# Patient Record
Sex: Female | Born: 1950
Health system: Southern US, Community
[De-identification: ages and names within clinical notes are randomized; demographics above are authoritative.]

## PROBLEM LIST (undated history)

## (undated) DIAGNOSIS — E119 Type 2 diabetes mellitus without complications: Secondary | ICD-10-CM

## (undated) DIAGNOSIS — Z86010 Personal history of colonic polyps: Secondary | ICD-10-CM

## (undated) DIAGNOSIS — Z800 Family history of malignant neoplasm of digestive organs: Secondary | ICD-10-CM

## (undated) DIAGNOSIS — J45909 Unspecified asthma, uncomplicated: Secondary | ICD-10-CM

## (undated) DIAGNOSIS — Z808 Family history of malignant neoplasm of other organs or systems: Secondary | ICD-10-CM

## (undated) HISTORY — DX: Type 2 diabetes mellitus without complications: E11.9

## (undated) HISTORY — PX: ABDOMINAL HYSTERECTOMY: SUR658

## (undated) HISTORY — DX: Family history of malignant neoplasm of digestive organs: Z80.0

## (undated) HISTORY — DX: Unspecified asthma, uncomplicated: J45.909

## (undated) HISTORY — DX: Family history of malignant neoplasm of other organs or systems: Z80.8

## (undated) HISTORY — PX: TUBAL LIGATION: SHX77

## (undated) HISTORY — PX: COLONOSCOPY: SHX174

## (undated) HISTORY — PX: POLYPECTOMY: SHX149

## (undated) HISTORY — DX: Personal history of colonic polyps: Z86.010

---

## 1999-01-25 ENCOUNTER — Other Ambulatory Visit: Admission: RE | Admit: 1999-01-25 | Discharge: 1999-01-25 | Payer: Self-pay | Admitting: Obstetrics and Gynecology

## 2001-10-01 ENCOUNTER — Other Ambulatory Visit: Admission: RE | Admit: 2001-10-01 | Discharge: 2001-10-01 | Payer: Self-pay | Admitting: Obstetrics and Gynecology

## 2001-10-01 ENCOUNTER — Encounter: Payer: Self-pay | Admitting: Obstetrics and Gynecology

## 2001-10-01 ENCOUNTER — Encounter: Admission: RE | Admit: 2001-10-01 | Discharge: 2001-10-01 | Payer: Self-pay | Admitting: Obstetrics and Gynecology

## 2001-12-12 ENCOUNTER — Ambulatory Visit (HOSPITAL_COMMUNITY): Admission: RE | Admit: 2001-12-12 | Discharge: 2001-12-12 | Payer: Self-pay | Admitting: Gastroenterology

## 2003-12-04 ENCOUNTER — Ambulatory Visit (HOSPITAL_BASED_OUTPATIENT_CLINIC_OR_DEPARTMENT_OTHER): Admission: RE | Admit: 2003-12-04 | Discharge: 2003-12-04 | Payer: Self-pay | Admitting: Orthopedic Surgery

## 2004-05-12 ENCOUNTER — Encounter (HOSPITAL_COMMUNITY): Admission: RE | Admit: 2004-05-12 | Discharge: 2004-06-11 | Payer: Self-pay | Admitting: Orthopaedic Surgery

## 2004-05-12 ENCOUNTER — Ambulatory Visit (HOSPITAL_COMMUNITY): Admission: RE | Admit: 2004-05-12 | Discharge: 2004-05-12 | Payer: Self-pay | Admitting: Orthopaedic Surgery

## 2011-03-10 ENCOUNTER — Other Ambulatory Visit: Payer: Self-pay | Admitting: Obstetrics & Gynecology

## 2011-03-10 DIAGNOSIS — Z1231 Encounter for screening mammogram for malignant neoplasm of breast: Secondary | ICD-10-CM

## 2011-03-13 ENCOUNTER — Other Ambulatory Visit: Payer: Self-pay | Admitting: Obstetrics & Gynecology

## 2011-03-13 DIAGNOSIS — Z780 Asymptomatic menopausal state: Secondary | ICD-10-CM

## 2011-03-14 ENCOUNTER — Ambulatory Visit
Admission: RE | Admit: 2011-03-14 | Discharge: 2011-03-14 | Disposition: A | Discharge status: Home / Self Care | Payer: 59 | Source: Ambulatory Visit | Attending: Obstetrics & Gynecology | Admitting: Obstetrics & Gynecology

## 2011-03-14 ENCOUNTER — Ambulatory Visit: Payer: Self-pay

## 2011-03-14 DIAGNOSIS — Z1231 Encounter for screening mammogram for malignant neoplasm of breast: Secondary | ICD-10-CM

## 2011-12-26 ENCOUNTER — Other Ambulatory Visit: Payer: Self-pay | Admitting: Gastroenterology

## 2012-09-30 ENCOUNTER — Ambulatory Visit (HOSPITAL_COMMUNITY)
Admission: RE | Admit: 2012-09-30 | Discharge: 2012-09-30 | Disposition: A | Discharge status: Home / Self Care | Payer: BC Managed Care – PPO | Source: Ambulatory Visit | Attending: Physician Assistant | Admitting: Physician Assistant

## 2012-09-30 ENCOUNTER — Other Ambulatory Visit (HOSPITAL_COMMUNITY): Payer: Self-pay | Admitting: Physician Assistant

## 2012-09-30 DIAGNOSIS — R059 Cough, unspecified: Secondary | ICD-10-CM | POA: Insufficient documentation

## 2012-09-30 DIAGNOSIS — R05 Cough: Secondary | ICD-10-CM | POA: Insufficient documentation

## 2012-09-30 DIAGNOSIS — J209 Acute bronchitis, unspecified: Secondary | ICD-10-CM

## 2012-09-30 DIAGNOSIS — R0602 Shortness of breath: Secondary | ICD-10-CM | POA: Insufficient documentation

## 2012-09-30 DIAGNOSIS — R062 Wheezing: Secondary | ICD-10-CM | POA: Insufficient documentation

## 2014-12-04 ENCOUNTER — Other Ambulatory Visit: Payer: Self-pay

## 2014-12-04 DIAGNOSIS — Z1231 Encounter for screening mammogram for malignant neoplasm of breast: Secondary | ICD-10-CM

## 2014-12-15 ENCOUNTER — Ambulatory Visit
Admission: RE | Admit: 2014-12-15 | Discharge: 2014-12-15 | Disposition: A | Discharge status: Home / Self Care | Payer: Medicare Other | Source: Ambulatory Visit

## 2014-12-15 ENCOUNTER — Encounter (INDEPENDENT_AMBULATORY_CARE_PROVIDER_SITE_OTHER): Payer: Self-pay

## 2014-12-15 DIAGNOSIS — Z1231 Encounter for screening mammogram for malignant neoplasm of breast: Secondary | ICD-10-CM

## 2015-03-02 ENCOUNTER — Other Ambulatory Visit (HOSPITAL_COMMUNITY): Payer: Self-pay | Admitting: Physician Assistant

## 2015-03-02 DIAGNOSIS — Z79899 Other long term (current) drug therapy: Secondary | ICD-10-CM

## 2015-03-02 DIAGNOSIS — M8580 Other specified disorders of bone density and structure, unspecified site: Secondary | ICD-10-CM

## 2015-03-08 ENCOUNTER — Ambulatory Visit (HOSPITAL_COMMUNITY)
Admission: RE | Admit: 2015-03-08 | Discharge: 2015-03-08 | Disposition: A | Discharge status: Home / Self Care | Payer: Medicare HMO | Source: Ambulatory Visit | Attending: Physician Assistant | Admitting: Physician Assistant

## 2015-03-08 DIAGNOSIS — Z79899 Other long term (current) drug therapy: Secondary | ICD-10-CM

## 2015-03-08 DIAGNOSIS — R29890 Loss of height: Secondary | ICD-10-CM | POA: Insufficient documentation

## 2015-03-08 DIAGNOSIS — M8580 Other specified disorders of bone density and structure, unspecified site: Secondary | ICD-10-CM | POA: Diagnosis not present

## 2015-03-08 DIAGNOSIS — Z780 Asymptomatic menopausal state: Secondary | ICD-10-CM | POA: Insufficient documentation

## 2016-02-16 ENCOUNTER — Ambulatory Visit (INDEPENDENT_AMBULATORY_CARE_PROVIDER_SITE_OTHER): Payer: Medicare HMO

## 2016-02-16 ENCOUNTER — Ambulatory Visit (INDEPENDENT_AMBULATORY_CARE_PROVIDER_SITE_OTHER): Payer: Medicare HMO | Admitting: Podiatry

## 2016-02-16 VITALS — BP 141/71 | HR 77 | Resp 16

## 2016-02-16 DIAGNOSIS — M79671 Pain in right foot: Secondary | ICD-10-CM

## 2016-02-16 DIAGNOSIS — M7731 Calcaneal spur, right foot: Secondary | ICD-10-CM | POA: Diagnosis not present

## 2016-02-16 DIAGNOSIS — M722 Plantar fascial fibromatosis: Secondary | ICD-10-CM

## 2016-02-16 NOTE — Patient Instructions (Signed)

## 2016-02-16 NOTE — Progress Notes (Signed)
   Subjective:    Patient ID: Katie Lucero, female    DOB: Oct 03, 1951, 65 y.o.   MRN: [id number]  HPI    Review of Systems  All other systems reviewed and are negative.      Objective:   Physical Exam        Assessment & Plan:

## 2016-02-17 NOTE — Progress Notes (Signed)
Subjective:     Patient ID: Katie Lucero, female   DOB: 1951-04-27, 65 y.o.   MRN: [id number]  HPI patient presents with a two-year history of extreme plantar fasciitis and the right heel. States that she's at the point where she cannot be active or do any type of walking and she has gained weight due to the discomfort. Patient states that she has tried injections tried inserts tried reduced activity and shoe gear modifications   Review of Systems  All other systems reviewed and are negative.      Objective:   Physical Exam  Constitutional: She is oriented to person, place, and time.  Cardiovascular: Intact distal pulses.   Musculoskeletal: Normal range of motion.  Neurological: She is oriented to person, place, and time.  Skin: Skin is warm.  Nursing note and vitals reviewed.  neurovascular status found to be intact muscle strength was adequate range of motion within normal limits with patient found to have exquisite discomfort plantar aspect right heel at the insertional point of the tendon into the calcaneus with inflammation and fluid around the medial band. Patient's found to have good digital perfusion is well oriented 3 with mild equinus condition noted and has mild depression of the arch noted     Assessment:     Acute plantar fasciitis right with inflammation and fluid of the medial and central band    Plan:   H&P and x-rays reviewed with patient. We discussed different treatment options available for this particular condition and due to the fact she's had previous injection she's had different conservative treatments and it's been present for 2 years I discussed surgery for shockwave therapy. I have opted for shockwave therapy which I think we'll be in her best interest and I explained is no guarantee that this will get it better and it is possible that ultimately we will need surgical intervention in this case. And at this time was given all instructions on shockwave and I  did dispense air fracture walker with all instructions on usage and patient will be seen back for shockwave therapy in the next week  X-ray report indicates large spur formation plantar heel with no indications of stress fracture arthritis

## 2016-02-21 ENCOUNTER — Other Ambulatory Visit: Payer: Medicare HMO

## 2016-02-23 ENCOUNTER — Ambulatory Visit (INDEPENDENT_AMBULATORY_CARE_PROVIDER_SITE_OTHER): Payer: Medicare HMO

## 2016-02-23 DIAGNOSIS — M7731 Calcaneal spur, right foot: Secondary | ICD-10-CM

## 2016-02-23 DIAGNOSIS — M722 Plantar fascial fibromatosis: Secondary | ICD-10-CM

## 2016-02-23 NOTE — Progress Notes (Signed)
   Subjective:    Patient ID: Katie Lucero, female    DOB: 01/23/1951, 65 y.o.   MRN: [id number]  HPI Pt presents with continuing pain in her right heel, she is here today for shockwave therapy   Review of Systems    All other systems negative Objective:   Physical Exam  Pain upon palpation of the right heel mid to medial heel pain present with tightness and slight swelling of the plantar fascia.     Assessment & Plan:  Shockwave therapy delivered to mid/medial heel band, right foot. Therapy reached levels of 3.2 at 17hz  for 3000 pulses, with therapeutic massage given  before shockwave session. Advised against usage of anti-inflammatories, advised to use supportive shoes. She will follow up in 1 week for 2nd treatment, sooner if problems arise

## 2016-02-28 ENCOUNTER — Other Ambulatory Visit: Payer: Medicare HMO

## 2016-03-01 ENCOUNTER — Ambulatory Visit (INDEPENDENT_AMBULATORY_CARE_PROVIDER_SITE_OTHER): Payer: Medicare HMO

## 2016-03-01 DIAGNOSIS — M722 Plantar fascial fibromatosis: Secondary | ICD-10-CM

## 2016-03-01 NOTE — Progress Notes (Signed)
   Subjective:    Patient ID: Katie Lucero, female    DOB: 1951-06-20, 65 y.o.   MRN: [id number]  HPI Pt presents with continuing pain in her right heel, she is here today for shockwave therapy. She states that her pain has improved from 9 out of 10 at last visit to 3 out of 10 at this visit.   Review of Systems    All other systems negative Objective:   Physical Exam   Mild pain upon palpation of the right heel mid to medial heel pain present with tightness,  Swelling has improved.      Assessment & Plan:  Shockwave therapy delivered to mid/medial heel band, right foot. Therapy reached levels of 4.4 at 17hz  for 3000 pulses, with therapeutic massage given after shockwave session. Advised against usage of anti-inflammatories, advised to use supportive shoes. She will follow up in 1 week for 3rd treatment, sooner if problems arise

## 2016-03-06 ENCOUNTER — Other Ambulatory Visit: Payer: Medicare HMO

## 2016-03-08 ENCOUNTER — Ambulatory Visit: Payer: Medicare HMO

## 2016-03-08 DIAGNOSIS — M722 Plantar fascial fibromatosis: Secondary | ICD-10-CM

## 2016-03-08 NOTE — Progress Notes (Signed)
   Subjective:    Patient ID: Katie Lucero, female    DOB: [birth year], 65 y.o.   MRN: [id number]  HPI Pt presents with continuing pain in her right heel, she is here today for shockwave therapy. She states that her pain has improved from 9 out of 10 at last visit to 3 out of 10 at this visit.   Review of Systems    All other systems negative Objective:   Physical Exam   Mild pain upon palpation of the right heel mid to medial heel pain present with tightness,  Swelling has improved.      Assessment & Plan:  Shockwave therapy delivered to mid/medial heel band, right foot. Therapy reached levels of 4.7 at 17hz  for 3000 pulses, with therapeutic massage given after shockwave session. Advised against usage of anti-inflammatories, advised to use supportive shoes. She will follow up in 4-6 weeks for 4th treatment, sooner if problems arise

## 2016-03-13 ENCOUNTER — Other Ambulatory Visit: Payer: Medicare HMO

## 2016-11-28 DIAGNOSIS — R1032 Left lower quadrant pain: Secondary | ICD-10-CM | POA: Diagnosis not present

## 2016-11-28 DIAGNOSIS — Z124 Encounter for screening for malignant neoplasm of cervix: Secondary | ICD-10-CM | POA: Diagnosis not present

## 2016-11-28 DIAGNOSIS — Z1231 Encounter for screening mammogram for malignant neoplasm of breast: Secondary | ICD-10-CM | POA: Diagnosis not present

## 2016-11-28 DIAGNOSIS — R102 Pelvic and perineal pain: Secondary | ICD-10-CM | POA: Diagnosis not present

## 2016-12-10 DIAGNOSIS — R05 Cough: Secondary | ICD-10-CM | POA: Diagnosis not present

## 2016-12-10 DIAGNOSIS — J111 Influenza due to unidentified influenza virus with other respiratory manifestations: Secondary | ICD-10-CM | POA: Diagnosis not present

## 2016-12-12 DIAGNOSIS — J069 Acute upper respiratory infection, unspecified: Secondary | ICD-10-CM | POA: Diagnosis not present

## 2016-12-12 DIAGNOSIS — Z6838 Body mass index (BMI) 38.0-38.9, adult: Secondary | ICD-10-CM | POA: Diagnosis not present

## 2016-12-12 DIAGNOSIS — Z1389 Encounter for screening for other disorder: Secondary | ICD-10-CM | POA: Diagnosis not present

## 2016-12-13 DIAGNOSIS — R1032 Left lower quadrant pain: Secondary | ICD-10-CM | POA: Diagnosis not present

## 2016-12-21 DIAGNOSIS — H5200 Hypermetropia, unspecified eye: Secondary | ICD-10-CM | POA: Diagnosis not present

## 2016-12-21 DIAGNOSIS — E119 Type 2 diabetes mellitus without complications: Secondary | ICD-10-CM | POA: Diagnosis not present

## 2016-12-21 DIAGNOSIS — Z0100 Encounter for examination of eyes and vision without abnormal findings: Secondary | ICD-10-CM | POA: Diagnosis not present

## 2017-04-04 DIAGNOSIS — Z0000 Encounter for general adult medical examination without abnormal findings: Secondary | ICD-10-CM | POA: Diagnosis not present

## 2017-04-04 DIAGNOSIS — Z9181 History of falling: Secondary | ICD-10-CM | POA: Diagnosis not present

## 2017-04-04 DIAGNOSIS — Z79899 Other long term (current) drug therapy: Secondary | ICD-10-CM | POA: Diagnosis not present

## 2017-04-04 DIAGNOSIS — E119 Type 2 diabetes mellitus without complications: Secondary | ICD-10-CM | POA: Diagnosis not present

## 2017-04-04 DIAGNOSIS — Z7984 Long term (current) use of oral hypoglycemic drugs: Secondary | ICD-10-CM | POA: Diagnosis not present

## 2017-05-16 DIAGNOSIS — Z6838 Body mass index (BMI) 38.0-38.9, adult: Secondary | ICD-10-CM | POA: Diagnosis not present

## 2017-05-16 DIAGNOSIS — Z1389 Encounter for screening for other disorder: Secondary | ICD-10-CM | POA: Diagnosis not present

## 2017-05-16 DIAGNOSIS — E669 Obesity, unspecified: Secondary | ICD-10-CM | POA: Diagnosis not present

## 2017-05-16 DIAGNOSIS — E119 Type 2 diabetes mellitus without complications: Secondary | ICD-10-CM | POA: Diagnosis not present

## 2017-07-23 DIAGNOSIS — L718 Other rosacea: Secondary | ICD-10-CM | POA: Diagnosis not present

## 2017-07-23 DIAGNOSIS — D485 Neoplasm of uncertain behavior of skin: Secondary | ICD-10-CM | POA: Diagnosis not present

## 2017-07-23 DIAGNOSIS — B078 Other viral warts: Secondary | ICD-10-CM | POA: Diagnosis not present

## 2017-07-23 DIAGNOSIS — L821 Other seborrheic keratosis: Secondary | ICD-10-CM | POA: Diagnosis not present

## 2017-08-08 DIAGNOSIS — R69 Illness, unspecified: Secondary | ICD-10-CM | POA: Diagnosis not present

## 2017-08-25 DIAGNOSIS — E119 Type 2 diabetes mellitus without complications: Secondary | ICD-10-CM | POA: Diagnosis not present

## 2017-08-25 DIAGNOSIS — J069 Acute upper respiratory infection, unspecified: Secondary | ICD-10-CM | POA: Diagnosis not present

## 2017-08-25 DIAGNOSIS — J029 Acute pharyngitis, unspecified: Secondary | ICD-10-CM | POA: Diagnosis not present

## 2017-08-27 DIAGNOSIS — J029 Acute pharyngitis, unspecified: Secondary | ICD-10-CM | POA: Diagnosis not present

## 2017-08-27 DIAGNOSIS — Z6838 Body mass index (BMI) 38.0-38.9, adult: Secondary | ICD-10-CM | POA: Diagnosis not present

## 2017-08-27 DIAGNOSIS — M436 Torticollis: Secondary | ICD-10-CM | POA: Diagnosis not present

## 2017-08-27 DIAGNOSIS — Z1389 Encounter for screening for other disorder: Secondary | ICD-10-CM | POA: Diagnosis not present

## 2017-09-12 DIAGNOSIS — E119 Type 2 diabetes mellitus without complications: Secondary | ICD-10-CM | POA: Diagnosis not present

## 2017-09-12 DIAGNOSIS — Z6838 Body mass index (BMI) 38.0-38.9, adult: Secondary | ICD-10-CM | POA: Diagnosis not present

## 2017-11-17 DIAGNOSIS — R69 Illness, unspecified: Secondary | ICD-10-CM | POA: Diagnosis not present

## 2018-01-16 DIAGNOSIS — Z0100 Encounter for examination of eyes and vision without abnormal findings: Secondary | ICD-10-CM | POA: Diagnosis not present

## 2018-01-16 DIAGNOSIS — H5200 Hypermetropia, unspecified eye: Secondary | ICD-10-CM | POA: Diagnosis not present

## 2018-01-16 DIAGNOSIS — E109 Type 1 diabetes mellitus without complications: Secondary | ICD-10-CM | POA: Diagnosis not present

## 2018-01-16 DIAGNOSIS — E119 Type 2 diabetes mellitus without complications: Secondary | ICD-10-CM | POA: Diagnosis not present

## 2018-03-18 DIAGNOSIS — Z1389 Encounter for screening for other disorder: Secondary | ICD-10-CM | POA: Diagnosis not present

## 2018-03-18 DIAGNOSIS — E119 Type 2 diabetes mellitus without complications: Secondary | ICD-10-CM | POA: Diagnosis not present

## 2018-03-18 DIAGNOSIS — Z6838 Body mass index (BMI) 38.0-38.9, adult: Secondary | ICD-10-CM | POA: Diagnosis not present

## 2018-03-27 ENCOUNTER — Telehealth: Payer: Self-pay | Admitting: Gastroenterology

## 2018-03-27 NOTE — Telephone Encounter (Signed)
Hi Dr. Silverio Decamp, this patient is looking to transfer her care over to you as one of her friends is a patient of yours. Her last colon was in 2013 and she is over due for a repeat one. We have received her records for your review. Please advise, thank you.

## 2018-03-28 NOTE — Telephone Encounter (Signed)
Ok to schedule if she is due for the procedure, please scan the previous colonoscopy report so I can review it. Thank you

## 2018-03-28 NOTE — Telephone Encounter (Signed)
Left message to call back  

## 2018-04-03 ENCOUNTER — Encounter: Payer: Self-pay | Admitting: Gastroenterology

## 2018-05-07 DIAGNOSIS — R102 Pelvic and perineal pain: Secondary | ICD-10-CM | POA: Diagnosis not present

## 2018-05-07 DIAGNOSIS — R1032 Left lower quadrant pain: Secondary | ICD-10-CM | POA: Diagnosis not present

## 2018-05-07 DIAGNOSIS — R140 Abdominal distension (gaseous): Secondary | ICD-10-CM | POA: Diagnosis not present

## 2018-05-09 ENCOUNTER — Other Ambulatory Visit: Payer: Self-pay | Admitting: Obstetrics & Gynecology

## 2018-05-09 DIAGNOSIS — R1084 Generalized abdominal pain: Secondary | ICD-10-CM

## 2018-05-09 DIAGNOSIS — R509 Fever, unspecified: Secondary | ICD-10-CM

## 2018-05-09 DIAGNOSIS — R1032 Left lower quadrant pain: Secondary | ICD-10-CM

## 2018-05-09 DIAGNOSIS — R140 Abdominal distension (gaseous): Secondary | ICD-10-CM

## 2018-05-15 ENCOUNTER — Encounter: Payer: Self-pay | Admitting: Gastroenterology

## 2018-05-15 ENCOUNTER — Other Ambulatory Visit: Payer: Self-pay

## 2018-05-15 ENCOUNTER — Ambulatory Visit (AMBULATORY_SURGERY_CENTER): Payer: Self-pay

## 2018-05-15 VITALS — Ht 64.5 in | Wt 225.8 lb

## 2018-05-15 DIAGNOSIS — Z86010 Personal history of colonic polyps: Secondary | ICD-10-CM

## 2018-05-15 DIAGNOSIS — Z800 Family history of malignant neoplasm of digestive organs: Secondary | ICD-10-CM

## 2018-05-15 MED ORDER — NA SULFATE-K SULFATE-MG SULF 17.5-3.13-1.6 GM/177ML PO SOLN: 1.0000 | Freq: Once | ORAL | 0 refills | Status: AC

## 2018-05-15 NOTE — Progress Notes (Signed)
No egg or soy allergy known to patient  No issues with past sedation with any surgeries  or procedures, no intubation problems  No diet pills per patient No home 02 use per patient  No blood thinners per patient  Pt denies issues with constipation  No A fib or A flutter  EMMI video sent to pt's e mail , pt declined    

## 2018-05-17 ENCOUNTER — Ambulatory Visit
Admission: RE | Admit: 2018-05-17 | Discharge: 2018-05-17 | Disposition: A | Discharge status: Home / Self Care | Payer: Medicare HMO | Source: Ambulatory Visit | Attending: Obstetrics & Gynecology | Admitting: Obstetrics & Gynecology

## 2018-05-17 DIAGNOSIS — R1032 Left lower quadrant pain: Secondary | ICD-10-CM

## 2018-05-17 DIAGNOSIS — R1084 Generalized abdominal pain: Secondary | ICD-10-CM

## 2018-05-17 DIAGNOSIS — K760 Fatty (change of) liver, not elsewhere classified: Secondary | ICD-10-CM | POA: Diagnosis not present

## 2018-05-17 DIAGNOSIS — R140 Abdominal distension (gaseous): Secondary | ICD-10-CM

## 2018-05-17 DIAGNOSIS — R509 Fever, unspecified: Secondary | ICD-10-CM

## 2018-05-29 ENCOUNTER — Ambulatory Visit (AMBULATORY_SURGERY_CENTER): Payer: Medicare HMO | Admitting: Gastroenterology

## 2018-05-29 ENCOUNTER — Encounter: Payer: Self-pay | Admitting: Gastroenterology

## 2018-05-29 VITALS — BP 147/70 | HR 61 | Temp 97.7°F | Resp 10 | Ht 64.0 in | Wt 225.0 lb

## 2018-05-29 DIAGNOSIS — Z800 Family history of malignant neoplasm of digestive organs: Secondary | ICD-10-CM

## 2018-05-29 DIAGNOSIS — D120 Benign neoplasm of cecum: Secondary | ICD-10-CM

## 2018-05-29 DIAGNOSIS — D122 Benign neoplasm of ascending colon: Secondary | ICD-10-CM

## 2018-05-29 DIAGNOSIS — Z86010 Personal history of colonic polyps: Secondary | ICD-10-CM | POA: Diagnosis not present

## 2018-05-29 DIAGNOSIS — Z1211 Encounter for screening for malignant neoplasm of colon: Secondary | ICD-10-CM | POA: Diagnosis not present

## 2018-05-29 DIAGNOSIS — D124 Benign neoplasm of descending colon: Secondary | ICD-10-CM | POA: Diagnosis not present

## 2018-05-29 DIAGNOSIS — J45909 Unspecified asthma, uncomplicated: Secondary | ICD-10-CM | POA: Diagnosis not present

## 2018-05-29 DIAGNOSIS — D123 Benign neoplasm of transverse colon: Secondary | ICD-10-CM

## 2018-05-29 DIAGNOSIS — E119 Type 2 diabetes mellitus without complications: Secondary | ICD-10-CM | POA: Diagnosis not present

## 2018-05-29 MED ORDER — SODIUM CHLORIDE 0.9 % IV SOLN
500.0000 mL | Freq: Once | INTRAVENOUS | Status: DC
Start: 2018-05-29 — End: 2018-05-29

## 2018-05-29 NOTE — Progress Notes (Signed)
Pt's states no medical or surgical changes since previsit or office visit. 

## 2018-05-29 NOTE — Progress Notes (Signed)
Called to room to assist during endoscopic procedure.  Patient ID and intended procedure confirmed with present staff. Received instructions for my participation in the procedure from the performing physician.  

## 2018-05-29 NOTE — Op Note (Signed)
[facility] Patient Name: Katie Lucero Procedure Date: 05/29/2018 12:03 PM MRN: [id number] Endoscopist: Mauri Pole , MD Age: 67 Referring MD:  Date of Birth: 1951/05/11 Gender: Female Account #: [id number] Procedure:                Colonoscopy Indications:              Screening in patient at increased risk: Family                            history of 1st-degree relative with colorectal                            cancer before age 28 years, Colon cancer screening                            in patient at increased risk: Family history of                            colorectal cancer in multiple 2nd degree relatives,                            High risk colon cancer surveillance: Personal                            history of colonic polyps Medicines:                Monitored Anesthesia Care Procedure:                Pre-Anesthesia Assessment:                           - Prior to the procedure, a History and Physical                            was performed, and patient medications and                            allergies were reviewed. The patient's tolerance of                            previous anesthesia was also reviewed. The risks                            and benefits of the procedure and the sedation                            options and risks were discussed with the patient.                            All questions were answered, and informed consent                            was obtained. Prior Anticoagulants: The patient has  taken no previous anticoagulant or antiplatelet                            agents. ASA Grade Assessment: II - A patient with                            mild systemic disease. After reviewing the risks                            and benefits, the patient was deemed in                            satisfactory condition to undergo the procedure.                           After obtaining informed consent, the  colonoscope                            was passed under direct vision. Throughout the                            procedure, the patient's blood pressure, pulse, and                            oxygen saturations were monitored continuously. The                            Colonoscope was introduced through the anus and                            advanced to the the cecum, identified by                            appendiceal orifice and ileocecal valve. The                            colonoscopy was performed without difficulty. The                            patient tolerated the procedure well. The quality                            of the bowel preparation was good. The ileocecal                            valve, appendiceal orifice, and rectum were                            photographed. Scope In: 12:12:30 PM Scope Out: 12:45:25 PM Scope Withdrawal Time: 0 hours 22 minutes 5 seconds  Total Procedure Duration: 0 hours 32 minutes 55 seconds  Findings:                 The perianal and digital rectal examinations were  normal.                           Three sessile polyps were found in the transverse                            colon, ascending colon and cecum. The polyps were 1                            to 3 mm in size. These polyps were removed with a                            cold biopsy forceps. Resection and retrieval were                            complete.                           The ileocecal valve was moderately lipomatous.                            Biopsies were taken with a cold forceps for                            histology.                           Five sessile polyps were found in the descending                            colon. The polyps were 4 to 8 mm in size. These                            polyps were removed with a cold snare. Resection                            and retrieval were complete.                           Non-bleeding  internal hemorrhoids were found during                            retroflexion. The hemorrhoids were medium-sized. Complications:            No immediate complications. Estimated Blood Loss:     Estimated blood loss was minimal. Impression:               - Three 1 to 3 mm polyps in the transverse colon,                            in the ascending colon and in the cecum, removed                            with a cold biopsy forceps. Resected and retrieved.                           -  Lipomatous ileocecal valve. Biopsied.                           - Five 4 to 8 mm polyps in the descending colon,                            removed with a cold snare. Resected and retrieved.                           - Non-bleeding internal hemorrhoids. Recommendation:           - Patient has a contact number available for                            emergencies. The signs and symptoms of potential                            delayed complications were discussed with the                            patient. Return to normal activities tomorrow.                            Written discharge instructions were provided to the                            patient.                           - Resume previous diet.                           - Continue present medications.                           - Await pathology results.                           - Repeat colonoscopy date to be determined after                            pending pathology results are reviewed for                            surveillance based on pathology results.                           - Refer to a genetics counselor at appointment to                            be scheduled. Mauri Pole, MD 05/29/2018 12:50:51 PM This report has been signed electronically.

## 2018-05-29 NOTE — Patient Instructions (Signed)
HANDOUTS GIVEN FOR POLYPS AND HEMORRHOIDS  YOU HAD AN ENDOSCOPIC PROCEDURE TODAY AT THE [facility]:   Refer to the procedure report that was given to you for any specific questions about what was found during the examination.  If the procedure report does not answer your questions, please call your gastroenterologist to clarify.  If you requested that your care partner not be given the details of your procedure findings, then the procedure report has been included in a sealed envelope for you to review at your convenience later.  YOU SHOULD EXPECT: Some feelings of bloating in the abdomen. Passage of more gas than usual.  Walking can help get rid of the air that was put into your GI tract during the procedure and reduce the bloating. If you had a lower endoscopy (such as a colonoscopy or flexible sigmoidoscopy) you may notice spotting of blood in your stool or on the toilet paper. If you underwent a bowel prep for your procedure, you may not have a normal bowel movement for a few days.  Please Note:  You might notice some irritation and congestion in your nose or some drainage.  This is from the oxygen used during your procedure.  There is no need for concern and it should clear up in a day or so.  SYMPTOMS TO REPORT IMMEDIATELY:   Following lower endoscopy (colonoscopy or flexible sigmoidoscopy):  Excessive amounts of blood in the stool  Significant tenderness or worsening of abdominal pains  Swelling of the abdomen that is new, acute  Fever of 100F or higher   For urgent or emergent issues, a gastroenterologist can be reached at any hour by calling [phone].   DIET:  We do recommend a small meal at first, but then you may proceed to your regular diet.  Drink plenty of fluids but you should avoid alcoholic beverages for 24 hours.  ACTIVITY:  You should plan to take it easy for the rest of today and you should NOT DRIVE or use heavy machinery until tomorrow (because of the  sedation medicines used during the test).    FOLLOW UP: Our staff will call the number listed on your records the next business day following your procedure to check on you and address any questions or concerns that you may have regarding the information given to you following your procedure. If we do not reach you, we will leave a message.  However, if you are feeling well and you are not experiencing any problems, there is no need to return our call.  We will assume that you have returned to your regular daily activities without incident.  If any biopsies were taken you will be contacted by phone or by letter within the next 1-3 weeks.  Please call us at [phone] if you have not heard about the biopsies in 3 weeks.    SIGNATURES/CONFIDENTIALITY: You and/or your care partner have signed paperwork which will be entered into your electronic medical record.  These signatures attest to the fact that that the information above on your After Visit Summary has been reviewed and is understood.  Full responsibility of the confidentiality of this discharge information lies with you and/or your care-partner. 

## 2018-05-29 NOTE — Progress Notes (Signed)
Report given to PACU, vss 

## 2018-05-30 ENCOUNTER — Other Ambulatory Visit: Payer: Self-pay

## 2018-05-30 ENCOUNTER — Telehealth: Payer: Self-pay

## 2018-05-30 DIAGNOSIS — Z86010 Personal history of colonic polyps: Secondary | ICD-10-CM

## 2018-05-30 DIAGNOSIS — Z800 Family history of malignant neoplasm of digestive organs: Secondary | ICD-10-CM

## 2018-05-30 NOTE — Telephone Encounter (Signed)
  Follow up Call-  Call Manson [facility] number 05/29/2018  Post procedure Call Rasheida Broden phone  # [phone]  Permission to leave phone message Yes  Some recent data might be hidden     Patient questions:  Do you have a fever, pain , or abdominal swelling? No. Pain Score  0 *  Have you tolerated food without any problems? Yes.    Have you been able to return to your normal activities? Yes.    Do you have any questions about your discharge instructions: Diet   No. Medications  No. Follow up visit  No.  Do you have questions or concerns about your Care? No.  Actions: * If pain score is 4 or above: No action needed, pain <4.

## 2018-05-31 ENCOUNTER — Telehealth: Payer: Self-pay | Admitting: Genetics

## 2018-05-31 ENCOUNTER — Encounter: Payer: Self-pay | Admitting: Genetics

## 2018-05-31 NOTE — Telephone Encounter (Signed)
New genetic counseling referral received from Dr. Silverio Decamp for fhx of colon cancer/personal hx of colonic polyps. Pt has been scheduled to see Ferol Luz on 8/27 at 11am. Address verified. Letter mailed.

## 2018-06-05 DIAGNOSIS — J029 Acute pharyngitis, unspecified: Secondary | ICD-10-CM | POA: Diagnosis not present

## 2018-06-05 DIAGNOSIS — B9689 Other specified bacterial agents as the cause of diseases classified elsewhere: Secondary | ICD-10-CM | POA: Diagnosis not present

## 2018-06-05 DIAGNOSIS — E119 Type 2 diabetes mellitus without complications: Secondary | ICD-10-CM | POA: Diagnosis not present

## 2018-06-05 DIAGNOSIS — Z1389 Encounter for screening for other disorder: Secondary | ICD-10-CM | POA: Diagnosis not present

## 2018-06-05 DIAGNOSIS — Z6838 Body mass index (BMI) 38.0-38.9, adult: Secondary | ICD-10-CM | POA: Diagnosis not present

## 2018-06-05 DIAGNOSIS — J019 Acute sinusitis, unspecified: Secondary | ICD-10-CM | POA: Diagnosis not present

## 2018-06-06 ENCOUNTER — Ambulatory Visit (INDEPENDENT_AMBULATORY_CARE_PROVIDER_SITE_OTHER): Payer: Medicare HMO | Admitting: Otolaryngology

## 2018-06-06 DIAGNOSIS — E119 Type 2 diabetes mellitus without complications: Secondary | ICD-10-CM | POA: Diagnosis not present

## 2018-06-13 ENCOUNTER — Encounter: Payer: Self-pay | Admitting: Gastroenterology

## 2018-06-25 ENCOUNTER — Inpatient Hospital Stay: Payer: Medicare HMO

## 2018-06-25 ENCOUNTER — Inpatient Hospital Stay: Payer: Medicare HMO | Attending: Genetic Counselor | Admitting: Genetics

## 2018-06-25 DIAGNOSIS — Z860100 Personal history of colon polyps, unspecified: Secondary | ICD-10-CM

## 2018-06-25 DIAGNOSIS — Z800 Family history of malignant neoplasm of digestive organs: Secondary | ICD-10-CM

## 2018-06-25 DIAGNOSIS — Z808 Family history of malignant neoplasm of other organs or systems: Secondary | ICD-10-CM

## 2018-06-25 DIAGNOSIS — D126 Benign neoplasm of colon, unspecified: Secondary | ICD-10-CM | POA: Diagnosis not present

## 2018-06-26 ENCOUNTER — Encounter: Payer: Self-pay | Admitting: Genetics

## 2018-06-26 DIAGNOSIS — Z808 Family history of malignant neoplasm of other organs or systems: Secondary | ICD-10-CM | POA: Insufficient documentation

## 2018-06-26 DIAGNOSIS — Z860100 Personal history of colon polyps, unspecified: Secondary | ICD-10-CM

## 2018-06-26 DIAGNOSIS — Z800 Family history of malignant neoplasm of digestive organs: Secondary | ICD-10-CM | POA: Insufficient documentation

## 2018-06-26 HISTORY — DX: Personal history of colonic polyps: Z86.010

## 2018-06-26 HISTORY — DX: Personal history of colon polyps, unspecified: Z86.0100

## 2018-06-26 NOTE — Progress Notes (Signed)
REFERRING PROVIDER: Mauri Pole, MD [place], [place] [id number]  PRIMARY PROVIDER:  Sharilyn Sites, MD  PRIMARY REASON FOR VISIT:  1. Family history of colon cancer   2. Family history of stomach cancer   3. Family history of skin cancer   4. Family history of esophageal cancer   5. Personal history of colonic polyps     HISTORY OF PRESENT ILLNESS:   Ms. Besecker, a 67 y.o. female, was seen for a [facility] cancer genetics consultation at the request of Dr. Silverio Decamp due to a personal history of colon polyps and significant family history of cancer.  Ms. Nest presents to clinic today to discuss the possibility of a hereditary predisposition to cancer, genetic testing, and to further clarify her future cancer risks, as well as potential cancer risks for family members.   In 2003, Ms. Stoffer had a tubulovillous adenoma removed from her colon, in 2013, a tubular adenoma was removed.  In July 2019, Ms. Balch had 8 polyps removed which were almost all tubular adenoma pathology.  Ms. Bocchino reports to [place] she has had other colonoscopies in addition to 2003, 2013, and 2019 and she has always had at least a few colon polyps.  We estimate 10+ colon adenomas have been identified.       HORMONAL RISK FACTORS:  Menarche was at age 52.  First live birth at age 20.  Ovaries intact: yes.  Hysterectomy: yes.  Menopausal status: postmenopausal.  HRT use: 0 years. Colonoscopy: yes; see comments above regarding polyps. Repots having breast calcifications for 20 years.    Past Medical History:  Diagnosis Date  . Asthma   . Diabetes mellitus without complication ([place])   . Family history of colon cancer   . Family history of esophageal cancer   . Family history of skin cancer   . Family history of stomach cancer   . Personal history of colonic polyps 06/26/2018    Past Surgical History:  Procedure Laterality Date  . ABDOMINAL HYSTERECTOMY     1968  . COLONOSCOPY    .  POLYPECTOMY    . TUBAL LIGATION     1981    Social History   Socioeconomic History  . Marital status: Married    Spouse name: Not on file  . Number of children: Not on file  . Years of education: Not on file  . Highest education level: Not on file  Occupational History  . Not on file  Social Needs  . Financial resource strain: Not on file  . Food insecurity:    Worry: Not on file    Inability: Not on file  . Transportation needs:    Medical: Not on file    Non-medical: Not on file  Tobacco Use  . Smoking status: Never Smoker  . Smokeless tobacco: Never Used  Substance and Sexual Activity  . Alcohol use: Yes    Alcohol/week: 0.0 standard drinks    Comment: rarely  . Drug use: Never  . Sexual activity: Not on file  Lifestyle  . Physical activity:    Days per week: Not on file    Minutes per session: Not on file  . Stress: Not on file  Relationships  . Social connections:    Talks on phone: Not on file    Gets together: Not on file    Attends religious service: Not on file    Active member of club or organization: Not on file  Attends meetings of clubs or organizations: Not on file    Relationship status: Not on file  Other Topics Concern  . Not on file  Social History Narrative  . Not on file     FAMILY HISTORY:  We obtained a detailed, 4-generation family history.  Significant diagnoses are listed below: Family History  Problem Relation Age of Onset  . Heart disease Mother   . Skin cancer Father   . Heart disease Father   . Colon cancer Sister 57  . Esophageal cancer Paternal Uncle   . Colon cancer Paternal Grandmother 63  . Colon cancer Cousin        dx >50  . Other Brother        brain tumor  . Cancer Maternal Grandmother 71       type unk  . Stomach cancer Paternal Uncle   . Colon cancer Cousin        dx >50  . Rectal cancer Neg Hx     Ms. Wamser has 2 daughters ages 68 and 8, and a son who is 71 with no history of cancer.  None of her  children have had a colonoscopy she is aware of.  Ms. Templin has 5 grandchildren. Ms. Margretta Sidle has 1 sister and 2 brothers: -1 brother died at birth -19 sister died at 58 due to a stroke.  She had a daughter who is currently 61 and in hospice due to 'bowel cancer'.  -1 sister died of colon cancer at 60. She has a daughter who is 15 with no history of cancer.  -1 brother died in older age due to a brain tumor- cannot remember the name of the tumor or weather it was cancerous or not.   Ms. Dombrosky father: died at 59 due to heart disease.  He had a hx of skin cancer.  Paternal Aunts/Uncles: 10 paternal aunts/uncles- limited information is available about all of these relatives.  However, Ms. Loma is aware of a paternal uncle who had stomach cancer and a paternal uncle who had esophageal cancer.  Paternal cousins: 2 paternal cousins had colon cancer dx >50 Paternal grandfather: died at 52 due to stomach cancer Paternal grandmother:died at 82 due to colon cancer.   Ms. Schnepf mother: died at 103 due to heart disease Maternal Aunts/Uncles: 5 maternal aunts/uncles, some died in htier 15's, but no cancer she is aware of Maternal cousins: no history of cancer Maternal grandfather: died of heart disease Maternal grandmother:died of cancer at 46, type of cancer unk  Ms. Westra is unaware of previous family history of genetic testing for hereditary cancer risks. Patient's maternal ancestors are of English/Irish descent, and paternal ancestors are of English/Nigerian descent. There is no reported Ashkenazi Jewish ancestry. There is no known consanguinity.  GENETIC COUNSELING ASSESSMENT: DUYEN BECKOM is a 67 y.o. female with a personal and family history which is somewhat suggestive of a Hereditary Cancer Predisposition Syndrome. We, therefore, discussed and recommended the following at today's visit.   DISCUSSION: We reviewed the characteristics, features and inheritance patterns of hereditary  cancer syndromes. We also discussed genetic testing, including the appropriate family members to test, the process of testing, insurance coverage and turn-around-time for results. We discussed the implications of a negative, positive and/or variant of uncertain significant result. We recommended Ms. Sharber pursue genetic testing for the Multi-Cancer gene panel.   The Multi-Cancer Panel offered by [facility] includes sequencing and/or deletion duplication testing of the following 84 genes: AIP,ALK, APC, ATM,  AXIN2,BAP1,  BARD1, BLM, BMPR1A, BRCA1, BRCA2, BRIP1, CASR, CDC73, CDH1, CDK4, CDKN1B, CDKN1C, CDKN2A (p14ARF), CDKN2A (p16INK4a), CEBPA, CHEK2, CTNNA1, DICER1, DIS3L2, EGFR (c.2369C>T, p.Thr790Met variant only), EPCAM (Deletion/duplication testing only), FH, FLCN, GATA2, GPC3, GREM1 (Promoter region deletion/duplication testing only), HOXB13 (c.251G>A, p.Gly84Glu), HRAS, KIT, MAX, MEN1, MET, MITF (c.952G>A, p.Glu318Lys variant only), MLH1, MSH2, MSH3, MSH6, MUTYH, NBN, NF1, NF2, NTHL1, PALB2, PDGFRA, PHOX2B, PMS2, POLD1, POLE, POT1, PRKAR1A, PTCH1, PTEN, RAD50, RAD51C, RAD51D, RB1, RECQL4, RET, RUNX1, SDHAF2, SDHA (sequence changes only), SDHB, SDHC, SDHD, SMAD4, SMARCA4, SMARCB1, SMARCE1, STK11, SUFU, TERC, TERT, TMEM127, TP53, TSC1, TSC2, VHL, WRN and WT1.    When an individual develops multiple adenomatous colon polyps, there is concern for a condition called Familial Adenomatous Polyposis (FAP).   In the classic form of FAP, people generally have hundreds to thousands of adenomatous polyps.  A milder version of FAP called Attenuated FAP (AFAP) is characterized by less than 100 adenomatous polyps.   There are two genes that have been associated with FAP/AFAP and each has a different pattern of inheritance.  The first gene is called APC and is inherited in an autosomal dominant fashion.  In this case, having only one alteration (mutation) in one copy of the APC gene causes polyps to develop.  In about 30%  of cases caused by APC, the condition is diagnosed for the first time in a family where both parents do not have the condition.  In other words, there are 30% of people with FAP/AFAP where the mutation occurred in them for the first time.     Colon polyposis can also be caused by mutations in the MUTYH gene, which causes a condition known as MUTYH-associated polyposis.  This is an autosomal recessive genetic condition.  In this case, an individual needs to have inherited a mutation in each copy of the MYH gene, one from each parent, in order to develop polyposis.  Carrying just one mutation of the MYH gene does not typically cause any problems or place an individual at risk for cancer.    Another syndrome we were concerned about in your family is called Lynch Syndrome, also called HNPCC (Hereditary Non-Polyposis Colon Cancer).  This syndrome increases the risk for colon, uterine, ovarian and stomach cancers, brain cancers, as well as others.  Families with Lynch Syndrome tend to have multiple family members with these cancers, typically diagnosed under age 18, and diagnoses in multiple generations. The genes that are known to cause Lynch Syndrome are called MLH1, MSH2, MSH6, PMS2 and EPCAM.     Some families that appear to have any of these conditions will have normal testing of these genes.  In those cases it is possible that the large amount of colon polyps may be causes by a syndrome called Serrated Polyposis Syndrome.  This condition causes an abnormally large amount of serrate polyps to develop and believe to be hereditary.  However, the genetic cause of this syndrome has not yet been identified.     We discussed that if she is found to have a mutation in one of these genes, it may impact future medical management recommendations such as increased cancer screenings and consideration of risk reducing surgeries.  A positive result could also have implications for the patient's family members.   A  Negative result would mean we were unable to identify a hereditary component to her development of adenomatous polyps, but does not rule out the possibility of a hereditary basis for her polyps. There could be mutations  that are undetectable by current technology, or in genes not yet tested or identified to increase cancer risk.     We discussed the potential to find a Variant of Uncertain Significance or VUS.  These are variants that have not yet been identified as pathogenic or benign, and it is unknown if this variant is associated with increased cancer risk or if this is a normal finding.  Most VUS's are reclassified to benign or likely benign.   It should not be used to make medical management decisions. With time, we suspect the lab will determine the significance of any VUS's identified if any.   Based on Ms. Detwiler's personal and family history of cancer, she meets medical criteria for genetic testing. Despite that she meets criteria, she may still have an out of pocket cost. The laboratory can provide her with an estimate of her OOP cost.  hospital was provided the contact information for the laboratory if hospital has further questions.   We discussed that some people do not want to undergo genetic testing due to fear of genetic discrimination.  A federal law called the Genetic Information Non-Discrimination Act (GINA) of 2008 helps protect individuals against genetic discrimination based on their genetic test results.  It impacts both health insurance and employment.  For health insurance, it protects against increased premiums, being kicked off insurance or being forced to take a test in order to be insured.  For employment it protects against hiring, firing and promoting decisions based on genetic test results.  Health status due to a cancer diagnosis is not protected under GINA.  This law does not protect life insurance, disability insurance, or other types of insurance.   PLAN: After  considering the risks, benefits, and limitations, Ms. Steger  provided informed consent to pursue genetic testing and the blood sample was sent to [facility] for analysis of the Multi-Cancer Panel. Results should be available within approximately 2-3 weeks' time, at which point they will be disclosed by telephone to Ms. Sarkis, as will any additional recommendations warranted by these results. Ms. Leitzke will receive a summary of her genetic counseling visit and a copy of her results once available. This information will also be available in Epic. We encouraged Ms. Sarkis to remain in contact with cancer genetics annually so that we can continuously update the family history and inform her of any changes in cancer genetics and testing that may be of benefit for her family. Ms. Merritts questions were answered to her satisfaction today. Our contact information was provided should additional questions or concerns arise.  Based on Ms. Kearse's family history, we recommended her siblings/nieces/nephews, and all paternal relatives also, have genetic counseling and testing. Ms. Tung will let us know if we can be of any assistance in coordinating genetic counseling and/or testing for this family member.   Lastly, we encouraged Ms. Gittens to remain in contact with cancer genetics annually so that we can continuously update the family history and inform her of any changes in cancer genetics and testing that may be of benefit for this family.   Ms.  Graig questions were answered to her satisfaction today. Our contact information was provided should additional questions or concerns arise. Thank you for the referral and allowing [place] to share in the care of your patient.   Tana Felts, Ms, [facility] Certified Genetic Counselor [email or web address] phone: [phone]  The patient was seen for a total of 40 minutes in face-to-face genetic counseling.

## 2018-07-08 ENCOUNTER — Encounter: Payer: Self-pay | Admitting: Genetics

## 2018-07-08 ENCOUNTER — Telehealth: Payer: Self-pay | Admitting: Genetics

## 2018-07-08 ENCOUNTER — Ambulatory Visit: Payer: Self-pay | Admitting: Genetics

## 2018-07-08 DIAGNOSIS — Z808 Family history of malignant neoplasm of other organs or systems: Secondary | ICD-10-CM

## 2018-07-08 DIAGNOSIS — Z1379 Encounter for other screening for genetic and chromosomal anomalies: Secondary | ICD-10-CM | POA: Insufficient documentation

## 2018-07-08 DIAGNOSIS — Z86010 Personal history of colonic polyps: Secondary | ICD-10-CM

## 2018-07-08 DIAGNOSIS — Z800 Family history of malignant neoplasm of digestive organs: Secondary | ICD-10-CM

## 2018-07-08 NOTE — Telephone Encounter (Signed)
Revealed negative genetic testing.    This normal result is reassuring and indicates we did not find a genetic cause for her colon polyps/family history of cancer.  However, genetic testing is not perfect, and cannot definitively rule out a hereditary cause.  It will be important for her to keep in contact with genetics to learn if any additional testing may be needed in the future.     We discussed that she still has a strong family history of cancer and personal history of colon polyps and she may still therefore be at a somewhat higher cancer risk simply due to family history.  She should continue to follow her GI /primary care/ other healthcare providers's recommendations regarding cancer screening.   We also discussed that there could still be Lynch syndrome or another hereditary cancer syndrome causing the cancer in her family that Ms. Peth may not have inherited and therefore we would not have identified in her.  We recommended her nices/nephews/siblings, and all paternal relatives also have genetic testing.   We also recommended her children have colonoscopy starting at 40 every 5 years due to Ms. Revelle's history of an advanced adenoma in 2003.   Per pt request we will mail a copy of her results to her.

## 2018-07-08 NOTE — Progress Notes (Signed)
HPI:  Ms. Landers was previously seen in the [facility] clinic on 06/25/2018 due to a personal history of colon polyps and family history of cancer and concerns regarding a hereditary predisposition to cancer. Please refer to our prior cancer genetics clinic note for more information regarding Ms. Spivack's medical, social and family histories, and our assessment and recommendations, at the time. Ms. Schlotzhauer recent genetic test results were disclosed to her, as well as recommendations warranted by these results. These results and recommendations are discussed in more detail below.  COLON POLYP HISTORY:  In 2003, Ms. Pettis had a tubulovillous adenoma removed from her colon, in 2013, a tubular adenoma was removed.  In July 2019, Ms. Ahola had 8 polyps removed which were almost all tubular adenoma pathology.  Ms. Millon reports to [place] she has had other colonoscopies in addition to 2003, 2013, and 2019 and she has always had at least a few colon polyps.  We estimate 10+ colon adenomas have been identified.      FAMILY HISTORY:  We obtained a detailed, 4-generation family history.  Significant diagnoses are listed below: Family History  Problem Relation Age of Onset  . Heart disease Mother   . Skin cancer Father   . Heart disease Father   . Colon cancer Sister 19  . Esophageal cancer Paternal Uncle   . Colon cancer Paternal Grandmother 63  . Colon cancer Cousin        dx >50  . Other Brother        brain tumor  . Cancer Maternal Grandmother 33       type unk  . Stomach cancer Paternal Uncle   . Colon cancer Cousin        dx >50  . Cancer Other 65       bowel  . Rectal cancer Neg Hx     Ms. Brys has 2 daughters ages 16 and 72, and a son who is 6 with no history of cancer.  None of her children have had a colonoscopy she is aware of.  Ms. Engdahl has 5 grandchildren. Ms. Margretta Sidle has 1 sister and 2 brothers: -1 brother died at birth -86 sister died at 57 due to a  stroke.  She had a daughter who is currently 70 and in hospice due to 'bowel cancer'.  -1 sister died of colon cancer at 62. She has a daughter who is 76 with no history of cancer- she has a daughter (patient's great niece) has recently had some 'cancerous cells in her ovaries'.  -1 brother died in older age due to a brain tumor- cannot remember the name of the tumor or weather it was cancerous or not.   Ms. Soth father: died at 70 due to heart disease.  He had a hx of skin cancer.  Paternal Aunts/Uncles: 10 paternal aunts/uncles- limited information is available about all of these relatives.  However, Ms. Joo is aware of a paternal uncle who had stomach cancer and a paternal uncle who had esophageal cancer.  Paternal cousins: 2 paternal cousins had colon cancer dx >50 Paternal grandfather: died at 8 due to stomach cancer Paternal grandmother:died at 76 due to colon cancer.   Ms. Turko mother: died at 20 due to heart disease Maternal Aunts/Uncles: 5 maternal aunts/uncles, some died in htier 67's, but no cancer she is aware of Maternal cousins: no history of cancer Maternal grandfather: died of heart disease Maternal grandmother:died of cancer at 57, type of cancer unk  Ms. Carriero is unaware of previous family history of genetic testing for hereditary cancer risks. Patient's maternal ancestors are of English/Irish descent, and paternal ancestors are of English/Nigerian descent. There is no reported Ashkenazi Jewish ancestry. There is no known consanguinity.  GENETIC TEST RESULTS: Genetic testing performed through [facility]'s Multi-Cancer Panel reported out on 07/08/2018 showed no pathogenic mutations.   The Multi-Cancer Panel offered by [facility] includes sequencing and/or deletion duplication testing of the following 91 genes: AIP, ALK, APC, ATM, AXIN2, BAP1, BARD1, BLM, BMPR1A, BRCA1, BRCA2, BRIP1, BUB1B, CASR, CDC73, CDH1, CDK4, CDKN1B, CDKN1C, CDKN2A, CEBPA, CEP57, CHEK2, CTNNA1,  DICER1, DIS3L2, EGFR, ENG, EPCAM, FH, FLCN, GALNT12, GATA2, GPC3, GREM1, HOXB13, HRAS, KIT, MAX, MEN1, MET, MITF, MLH1, MLH3, MSH2, MSH3, MSH6, MUTYH, NBN, NF1, NF2, NTHL1, PALB2, PDGFRA, PHOX2B, PMS2, POLD1, POLE, POT1, PRKAR1A, PTCH1, PTEN, RAD50, RAD51C, RAD51D, RB1, RECQL4, RET, RNF43, RPS20, RUNX1, SDHA, SDHAF2, SDHB, SDHC, SDHD, SMAD4, SMARCA4, SMARCB1, SMARCE1, STK11, SUFU, TERC, TERT, TMEM127, TP53, TSC1, TSC2, VHL, WRN, WT1  The test report will be scanned into EPIC and will be located under the Molecular Pathology section of the Results Review tab. A portion of the result report is included below for reference.     We discussed with Ms. Daily that because current genetic testing is not perfect, it is possible there may be a gene mutation in one of these genes that current testing cannot detect, but that chance is small.  We also discussed, that there could be another gene that has not yet been discovered, or that we have not yet tested, that is responsible for the cancer diagnoses in the family. It is also possible there is a hereditary cause for the cancer in the family that Ms. Langner did not inherit and therefore was not identified in her testing.  Therefore, it is important to remain in touch with cancer genetics in the future so that we can continue to offer Ms. Age the most up to date genetic testing.   ADDITIONAL GENETIC TESTING:We discussed with Ms. Mancias that her genetic testing was fairly extensive.  If there are are genes identified to increase cancer risk that can be analyzed in the future, we would be happy to discuss and coordinate this testing at that time.    CANCER SCREENING RECOMMENDATIONS: This negative result means that we were unable to identify a hereditary cause for her personal history of colon polyps and family history of cancer at this time.  This result does not rule out a hereditary cause for her  cancer.  It is still possible that there could be genetic  mutations that are undetectable by current technology, or genetic mutations in genes that have not been tested or identified to increase cancer risk.  Given Ms. Blough's personal and family histories, we must consider these negative results carefully.  Families with features suggestive of hereditary risk for cancer tend to have multiple family members with cancer, diagnoses in multiple generations, and diagnoses before the age of 40. Ms. Roberge family exhibits some of these features. Therefore this negative result may actually be due to the limitations of current technology to detect all mutations within these genes, or there may be a different gene that has not yet been discovered or tested.   Another possibility we discussed is that there is a hereditary cause for the cancer in the family that Ms. Royal did not inherit and therefore was not found in her genetic testing.  Therefore, we strongly recommended her relatives (siblings/nieces/nephews/paternal relatives) also  have genetic testing.  Ms. Tebbetts will let us know if we can be of any assistance in coordinating genetic counseling and/or testing for these family members.  It is recommended Ms. Cuadros continue to follow the cancer management and screening guidelines provided by her oncology and primary healthcare provider. Given her history of polyps, she should receive increased colon screening per GI physician recommendations.  An individual's cancer risk is not determined by genetic test results alone.  Overall cancer risk assessment includes additional factors such as personal medical history, family history, etc.  These should be used to make a personalized plan for cancer prevention and surveillance.    RECOMMENDATIONS FOR FAMILY MEMBERS:  Relatives in this family might be at some increased risk of developing cancer, over the general population risk, simply due to the family history of cancer.  We recommended women in this family have a  yearly mammogram beginning at age 53, or 50 years younger than the earliest onset of cancer, an annual clinical breast exam, and perform monthly breast self-exams. Women in this family should also have a gynecological exam as recommended by their primary provider. All family members should have a colonoscopy by age 37 (or as directed by their doctors).  All family members should inform their physicians about the family history of cancer so their doctors can make the most appropriate screening recommendations for them.   Based on Ms. Leber's personal history of advanced adenoma, we recommended her children have colonoscopies starting at age 58 and repeating every 5-10 years.    FOLLOW-UP: Lastly, we discussed with Ms. Ackroyd that cancer genetics is a rapidly advancing field and it is possible that new genetic tests will be appropriate for her and/or her family members in the future. We encouraged her to remain in contact with cancer genetics on an annual basis so we can update her personal and family histories and let her know of advances in cancer genetics that may benefit this family.   Our contact number was provided. Ms. Tryon questions were answered to her satisfaction, and she knows she is welcome to call us at anytime with additional questions or concerns.   Ferol Luz, MS, [facility] Certified Genetic Counselor [email or web address]

## 2018-07-15 ENCOUNTER — Ambulatory Visit (INDEPENDENT_AMBULATORY_CARE_PROVIDER_SITE_OTHER): Payer: Medicare HMO | Admitting: Otolaryngology

## 2018-07-15 DIAGNOSIS — R42 Dizziness and giddiness: Secondary | ICD-10-CM

## 2018-07-15 DIAGNOSIS — H903 Sensorineural hearing loss, bilateral: Secondary | ICD-10-CM | POA: Diagnosis not present

## 2018-07-15 DIAGNOSIS — H8112 Benign paroxysmal vertigo, left ear: Secondary | ICD-10-CM | POA: Diagnosis not present

## 2018-08-12 ENCOUNTER — Ambulatory Visit (INDEPENDENT_AMBULATORY_CARE_PROVIDER_SITE_OTHER): Payer: Medicare HMO | Admitting: Otolaryngology

## 2018-08-12 DIAGNOSIS — H8112 Benign paroxysmal vertigo, left ear: Secondary | ICD-10-CM

## 2018-08-27 DIAGNOSIS — R69 Illness, unspecified: Secondary | ICD-10-CM | POA: Diagnosis not present

## 2018-09-02 DIAGNOSIS — R69 Illness, unspecified: Secondary | ICD-10-CM | POA: Diagnosis not present

## 2018-09-19 DIAGNOSIS — Z0001 Encounter for general adult medical examination with abnormal findings: Secondary | ICD-10-CM | POA: Diagnosis not present

## 2018-09-19 DIAGNOSIS — Z6838 Body mass index (BMI) 38.0-38.9, adult: Secondary | ICD-10-CM | POA: Diagnosis not present

## 2018-09-19 DIAGNOSIS — E119 Type 2 diabetes mellitus without complications: Secondary | ICD-10-CM | POA: Diagnosis not present

## 2018-09-19 DIAGNOSIS — Z1389 Encounter for screening for other disorder: Secondary | ICD-10-CM | POA: Diagnosis not present

## 2018-10-25 DIAGNOSIS — E119 Type 2 diabetes mellitus without complications: Secondary | ICD-10-CM | POA: Diagnosis not present

## 2018-10-25 DIAGNOSIS — J209 Acute bronchitis, unspecified: Secondary | ICD-10-CM | POA: Diagnosis not present

## 2018-11-12 DIAGNOSIS — M545 Low back pain: Secondary | ICD-10-CM | POA: Diagnosis not present

## 2018-11-12 DIAGNOSIS — M25552 Pain in left hip: Secondary | ICD-10-CM | POA: Diagnosis not present

## 2018-11-12 DIAGNOSIS — M5442 Lumbago with sciatica, left side: Secondary | ICD-10-CM | POA: Diagnosis not present

## 2018-11-18 DIAGNOSIS — M25552 Pain in left hip: Secondary | ICD-10-CM | POA: Diagnosis not present

## 2018-11-18 DIAGNOSIS — M7062 Trochanteric bursitis, left hip: Secondary | ICD-10-CM | POA: Diagnosis not present

## 2018-11-27 DIAGNOSIS — Z6838 Body mass index (BMI) 38.0-38.9, adult: Secondary | ICD-10-CM | POA: Diagnosis not present

## 2018-11-27 DIAGNOSIS — Z0001 Encounter for general adult medical examination with abnormal findings: Secondary | ICD-10-CM | POA: Diagnosis not present

## 2018-11-27 DIAGNOSIS — Z1389 Encounter for screening for other disorder: Secondary | ICD-10-CM | POA: Diagnosis not present

## 2018-12-05 DIAGNOSIS — L718 Other rosacea: Secondary | ICD-10-CM | POA: Diagnosis not present

## 2019-03-17 DIAGNOSIS — Z1389 Encounter for screening for other disorder: Secondary | ICD-10-CM | POA: Diagnosis not present

## 2019-03-17 DIAGNOSIS — E119 Type 2 diabetes mellitus without complications: Secondary | ICD-10-CM | POA: Diagnosis not present

## 2019-03-17 DIAGNOSIS — Z6838 Body mass index (BMI) 38.0-38.9, adult: Secondary | ICD-10-CM | POA: Diagnosis not present

## 2019-04-04 DIAGNOSIS — B078 Other viral warts: Secondary | ICD-10-CM | POA: Diagnosis not present

## 2019-06-30 DIAGNOSIS — J4520 Mild intermittent asthma, uncomplicated: Secondary | ICD-10-CM | POA: Diagnosis not present

## 2019-06-30 DIAGNOSIS — E119 Type 2 diabetes mellitus without complications: Secondary | ICD-10-CM | POA: Diagnosis not present

## 2019-06-30 DIAGNOSIS — E669 Obesity, unspecified: Secondary | ICD-10-CM | POA: Diagnosis not present

## 2019-07-30 DIAGNOSIS — E119 Type 2 diabetes mellitus without complications: Secondary | ICD-10-CM | POA: Diagnosis not present

## 2019-07-30 DIAGNOSIS — J4520 Mild intermittent asthma, uncomplicated: Secondary | ICD-10-CM | POA: Diagnosis not present

## 2019-09-03 DIAGNOSIS — R69 Illness, unspecified: Secondary | ICD-10-CM | POA: Diagnosis not present

## 2019-09-24 DIAGNOSIS — E119 Type 2 diabetes mellitus without complications: Secondary | ICD-10-CM | POA: Diagnosis not present

## 2019-09-24 DIAGNOSIS — Z733 Stress, not elsewhere classified: Secondary | ICD-10-CM | POA: Diagnosis not present

## 2019-09-24 DIAGNOSIS — L259 Unspecified contact dermatitis, unspecified cause: Secondary | ICD-10-CM | POA: Diagnosis not present

## 2019-09-24 DIAGNOSIS — Z6837 Body mass index (BMI) 37.0-37.9, adult: Secondary | ICD-10-CM | POA: Diagnosis not present

## 2019-09-24 DIAGNOSIS — Z1389 Encounter for screening for other disorder: Secondary | ICD-10-CM | POA: Diagnosis not present

## 2019-09-24 DIAGNOSIS — Z0001 Encounter for general adult medical examination with abnormal findings: Secondary | ICD-10-CM | POA: Diagnosis not present

## 2019-10-18 ENCOUNTER — Ambulatory Visit
Admission: EM | Admit: 2019-10-18 | Discharge: 2019-10-18 | Disposition: A | Discharge status: Home / Self Care | Payer: Medicare HMO | Attending: Emergency Medicine | Admitting: Emergency Medicine

## 2019-10-18 DIAGNOSIS — H6121 Impacted cerumen, right ear: Secondary | ICD-10-CM | POA: Diagnosis not present

## 2019-10-18 MED ORDER — CARBAMIDE PEROXIDE 6.5 % OT SOLN: 5.0000 [drp] | Freq: Two times a day (BID) | OTIC | 0 refills | Status: DC

## 2019-10-18 MED ORDER — FLUTICASONE PROPIONATE 50 MCG/ACT NA SUSP: 1.0000 | Freq: Every day | NASAL | 0 refills | Status: DC

## 2019-10-18 NOTE — ED Triage Notes (Signed)
Pt has had increased ear fullness over the past 2 weeks in right ear

## 2019-10-18 NOTE — ED Provider Notes (Signed)
[facility]    CSN: [id number] Arrival date & time: 10/18/19  1049      History   Chief Complaint Chief Complaint  Patient presents with  . Ear Fullness    HPI Katie Lucero is a 68 y.o. female.   Katie Lucero 68 y female presents to the urgent care with right ear fullness and mild pain for the past 2 weeks.  He denies a precipitating event, such as swimming or wearing ear plugs.  He states the pain is constant and achy in character.  He rated pain at 2 on a scale of 1-10.  He has tried OTC Tylenol without relief without relief.  His symptoms are made worse with lying down.  He reports denies similar symptoms in the past that improved with nasal spray.    The history is provided by the patient. A language interpreter was used.  Ear Fullness    Past Medical History:  Diagnosis Date  . Asthma   . Diabetes mellitus without complication ([place])   . Family history of colon cancer   . Family history of esophageal cancer   . Family history of skin cancer   . Family history of stomach cancer   . Personal history of colonic polyps 06/26/2018    Patient Active Problem List   Diagnosis Date Noted  . Genetic testing 07/08/2018  . Personal history of colonic polyps 06/26/2018  . Family history of colon cancer   . Family history of stomach cancer   . Family history of skin cancer   . Family history of esophageal cancer     Past Surgical History:  Procedure Laterality Date  . ABDOMINAL HYSTERECTOMY     1968  . COLONOSCOPY    . POLYPECTOMY    . TUBAL LIGATION     1981    OB History   No obstetric history on file.      Home Medications    Prior to Admission medications   Medication Sig Start Date End Date Taking? Authorizing Provider  carbamide peroxide (DEBROX) 6.5 % OTIC solution Place 5 drops into both ears 2 (two) times daily. 10/18/19   Julee Stoll, Darrelyn Hillock, FNP  Cyanocobalamin (B-12) 100 MCG TABS Take by mouth.    [provider]    fluticasone (FLONASE) 50 MCG/ACT nasal spray Place 1 spray into both nostrils daily. 10/18/19   Avry Roedl, Darrelyn Hillock, FNP  metFORMIN (GLUCOPHAGE) 1000 MG tablet Take 1,000 mg by mouth 2 (two) times daily with a meal.    [provider]  metroNIDAZOLE (METROCREAM) 0.75 % cream  04/22/18   [provider]    Family History Family History  Problem Relation Age of Onset  . Heart disease Mother   . Skin cancer Father   . Heart disease Father   . Colon cancer Sister 63  . Esophageal cancer Paternal Uncle   . Colon cancer Paternal Grandmother 61  . Colon cancer Cousin        dx >50  . Other Brother        brain tumor  . Cancer Maternal Grandmother 19       type unk  . Stomach cancer Paternal Uncle   . Colon cancer Cousin        dx >50  . Cancer Other 65       bowel  . Rectal cancer Neg Hx     Social History Social History   Tobacco Use  . Smoking status: Never Smoker  .  Smokeless tobacco: Never Used  Substance Use Topics  . Alcohol use: Yes    Alcohol/week: 0.0 standard drinks    Comment: rarely  . Drug use: Never     Allergies   Levaquin [levofloxacin in d5w]   Review of Systems Review of Systems  Constitutional: Negative.   HENT: Positive for ear pain.        Ear Fullness  Respiratory: Negative.   Cardiovascular: Negative.   Gastrointestinal: Negative.   Neurological: Negative.   ROS: All other are negatives   Physical Exam Triage Vital Signs ED Triage Vitals  Enc Vitals Group     BP 10/18/19 1108 (!) 146/85     Pulse Rate 10/18/19 1108 72     Resp 10/18/19 1108 18     Temp 10/18/19 1108 98 F (36.7 C)     Temp src --      SpO2 10/18/19 1108 97 %     Weight --      Height --      Head Circumference --      Peak Flow --      Pain Score 10/18/19 1106 0     Pain Loc --      Pain Edu? --      Excl. in [place]? --    No data found.  Updated Vital Signs BP (!) 146/85   Pulse 72   Temp 98 F (36.7 C)   Resp 18   SpO2 97%   Visual  Acuity Right Eye Distance:   Left Eye Distance:   Bilateral Distance:    Right Eye Near:   Left Eye Near:    Bilateral Near:     Physical Exam Constitutional:      General: She is not in acute distress.    Appearance: Normal appearance. She is normal weight. She is not ill-appearing or toxic-appearing.  HENT:     Head: Normocephalic.     Right Ear: There is impacted cerumen.     Left Ear: Tympanic membrane, ear canal and external ear normal. There is no impacted cerumen.     Ears:     Comments: Post ear wax removal: Right ear tympanic membrane, ear canal and external ear are normal    Nose: Nose normal. No congestion.     Mouth/Throat:     Mouth: Mucous membranes are moist.     Pharynx: No oropharyngeal exudate or posterior oropharyngeal erythema.  Cardiovascular:     Rate and Rhythm: Normal rate and regular rhythm.     Pulses: Normal pulses.     Heart sounds: Normal heart sounds. No murmur.  Pulmonary:     Effort: Pulmonary effort is normal. No respiratory distress.     Breath sounds: No wheezing or rhonchi.  Chest:     Chest wall: No tenderness.  Abdominal:     General: Abdomen is flat. Bowel sounds are normal. There is no distension.     Palpations: There is no mass.  Skin:    Capillary Refill: Capillary refill takes less than 2 seconds.  Neurological:     Mental Status: She is alert and oriented to person, place, and time.      UC Treatments / Results  Labs (all labs ordered are listed, but only abnormal results are displayed) Labs Reviewed - No data to display  EKG   Radiology No results found.  Procedures Procedures (including critical care time)  Medications Ordered in UC Medications - No data to display  Initial Impression /  Assessment and Plan / UC Course  I have reviewed the triage vital signs and the nursing notes.  Pertinent labs & imaging results that were available during my care of the patient were reviewed by me and considered in my  medical decision making (see chart for details).   Patient stable for discharge.  Earwax was removed in office.  Flonase and Debrox will be prescribed.  Advised patient to follow-up with primary care or to return for worsening symptoms.  Final Clinical Impressions(s) / UC Diagnoses   Final diagnoses:  Impacted cerumen of right ear     Discharge Instructions     Advised patient to use Debrox as prescribed Advised patient to use Flonase as prescribed Return for worsening symptoms    ED Prescriptions    Medication Sig Dispense Auth. Provider   fluticasone (FLONASE) 50 MCG/ACT nasal spray Place 1 spray into both nostrils daily. 16 g Timia Casselman S, FNP   carbamide peroxide (DEBROX) 6.5 % OTIC solution Place 5 drops into both ears 2 (two) times daily. 15 mL Adelyna Brockman, Darrelyn Hillock, FNP     PDMP not reviewed this encounter.   Emerson Monte, [place] 10/18/19 1134

## 2019-10-18 NOTE — Discharge Instructions (Signed)
Advised patient to use Debrox as prescribed Advised patient to use Flonase as prescribed Return for worsening symptoms

## 2019-11-11 ENCOUNTER — Ambulatory Visit: Payer: Medicare HMO | Attending: Internal Medicine

## 2019-11-11 ENCOUNTER — Other Ambulatory Visit: Payer: Self-pay

## 2019-11-11 DIAGNOSIS — Z20822 Contact with and (suspected) exposure to covid-19: Secondary | ICD-10-CM

## 2019-11-13 LAB — NOVEL CORONAVIRUS, NAA: SARS-CoV-2, NAA: NOT DETECTED

## 2020-02-05 DIAGNOSIS — H52223 Regular astigmatism, bilateral: Secondary | ICD-10-CM | POA: Diagnosis not present

## 2020-02-05 DIAGNOSIS — H2513 Age-related nuclear cataract, bilateral: Secondary | ICD-10-CM | POA: Diagnosis not present

## 2020-02-05 DIAGNOSIS — H5203 Hypermetropia, bilateral: Secondary | ICD-10-CM | POA: Diagnosis not present

## 2020-02-05 DIAGNOSIS — E119 Type 2 diabetes mellitus without complications: Secondary | ICD-10-CM | POA: Diagnosis not present

## 2020-02-06 ENCOUNTER — Other Ambulatory Visit: Payer: Self-pay | Admitting: Physician Assistant

## 2020-02-06 DIAGNOSIS — Z1231 Encounter for screening mammogram for malignant neoplasm of breast: Secondary | ICD-10-CM

## 2020-02-11 DIAGNOSIS — Z0100 Encounter for examination of eyes and vision without abnormal findings: Secondary | ICD-10-CM | POA: Diagnosis not present

## 2020-02-25 ENCOUNTER — Ambulatory Visit: Payer: Medicare HMO | Admitting: Gastroenterology

## 2020-03-03 ENCOUNTER — Other Ambulatory Visit: Payer: Self-pay

## 2020-03-03 ENCOUNTER — Ambulatory Visit
Admission: RE | Admit: 2020-03-03 | Discharge: 2020-03-03 | Disposition: A | Discharge status: Home / Self Care | Payer: Medicare HMO | Source: Ambulatory Visit | Attending: Physician Assistant | Admitting: Physician Assistant

## 2020-03-03 DIAGNOSIS — Z1231 Encounter for screening mammogram for malignant neoplasm of breast: Secondary | ICD-10-CM | POA: Diagnosis not present

## 2020-03-25 ENCOUNTER — Ambulatory Visit: Payer: Medicare HMO | Admitting: Gastroenterology

## 2020-04-08 DIAGNOSIS — L820 Inflamed seborrheic keratosis: Secondary | ICD-10-CM | POA: Diagnosis not present

## 2020-04-08 DIAGNOSIS — D1801 Hemangioma of skin and subcutaneous tissue: Secondary | ICD-10-CM | POA: Diagnosis not present

## 2020-04-08 DIAGNOSIS — L718 Other rosacea: Secondary | ICD-10-CM | POA: Diagnosis not present

## 2020-06-17 DIAGNOSIS — E119 Type 2 diabetes mellitus without complications: Secondary | ICD-10-CM | POA: Diagnosis not present

## 2020-06-17 DIAGNOSIS — Z1389 Encounter for screening for other disorder: Secondary | ICD-10-CM | POA: Diagnosis not present

## 2020-06-17 DIAGNOSIS — Z6838 Body mass index (BMI) 38.0-38.9, adult: Secondary | ICD-10-CM | POA: Diagnosis not present

## 2020-08-05 IMAGING — MG DIGITAL SCREENING BILAT W/ TOMO W/ CAD
8 series · 8 of 24 positions shown · non-contrast
Comparison: Previous exam(s).

CLINICAL DATA: Screening.

EXAM:
DIGITAL SCREENING BILATERAL MAMMOGRAM WITH TOMO AND CAD

[L MLO synth-2D]
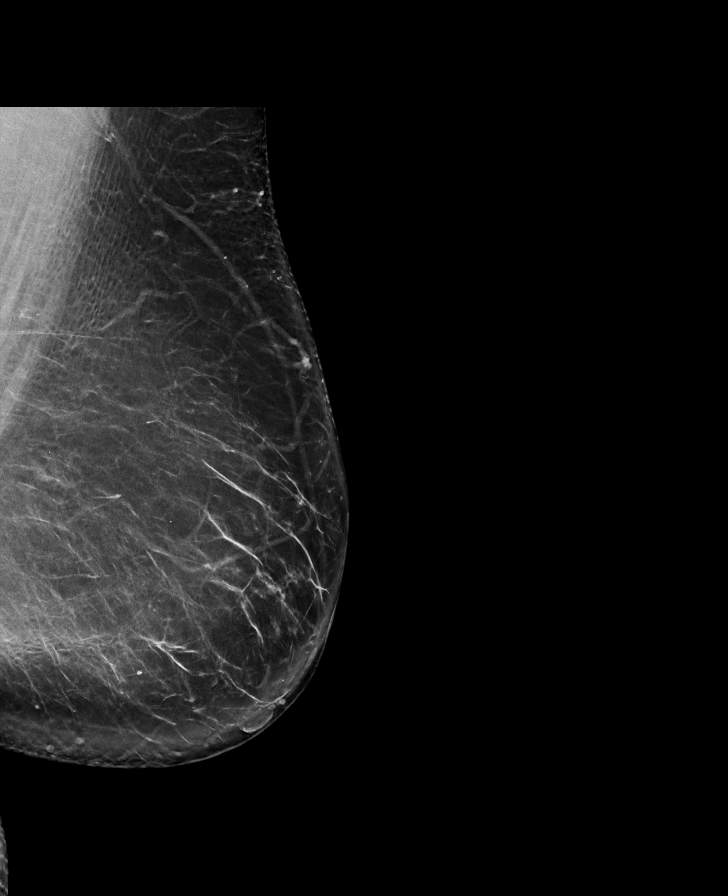

[L CC synth-2D]
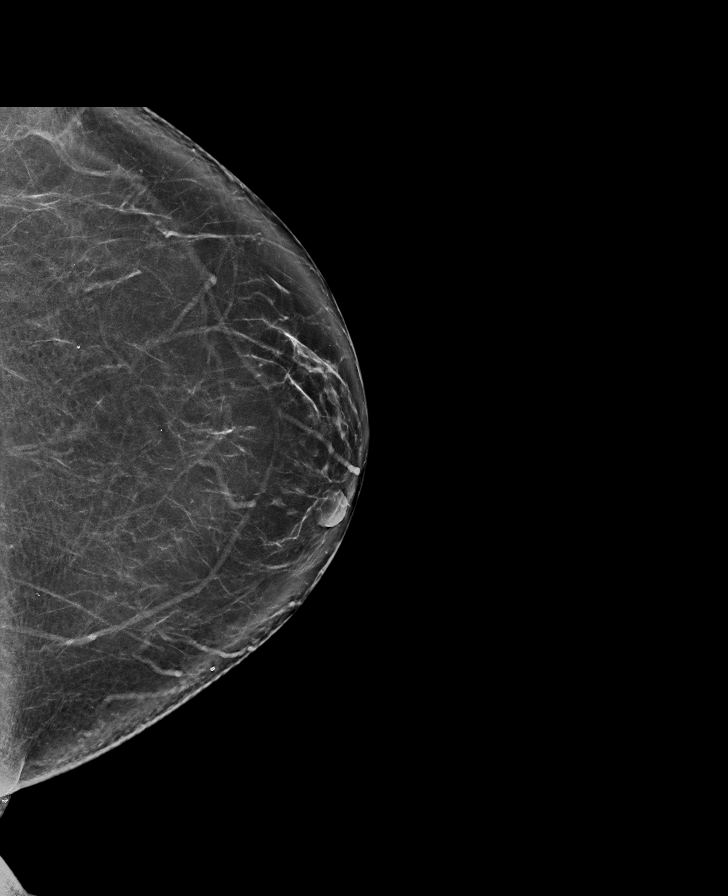

[R CC synth-2D]
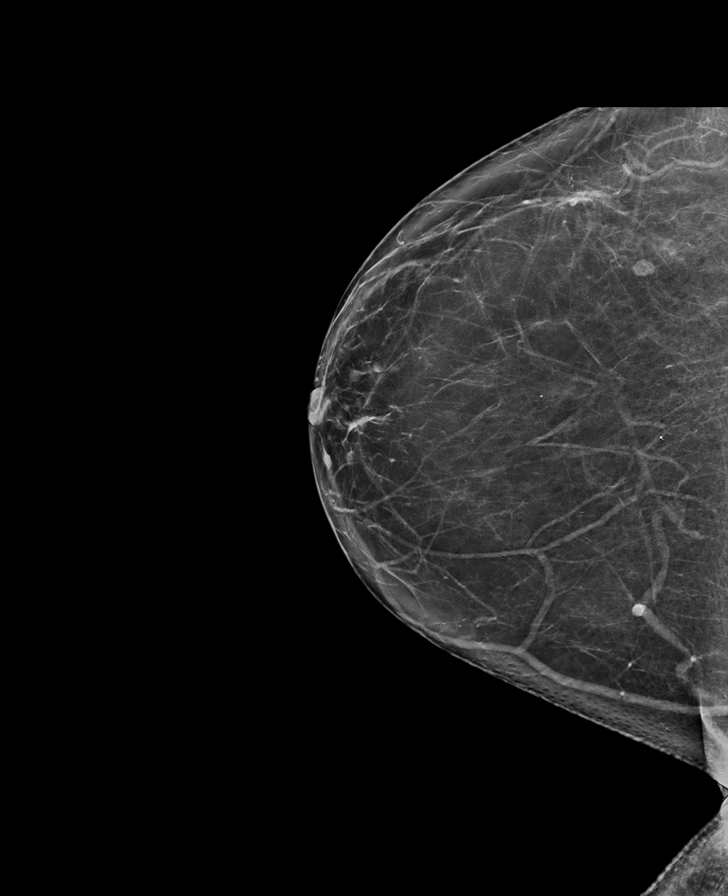

[R MLO synth-2D]
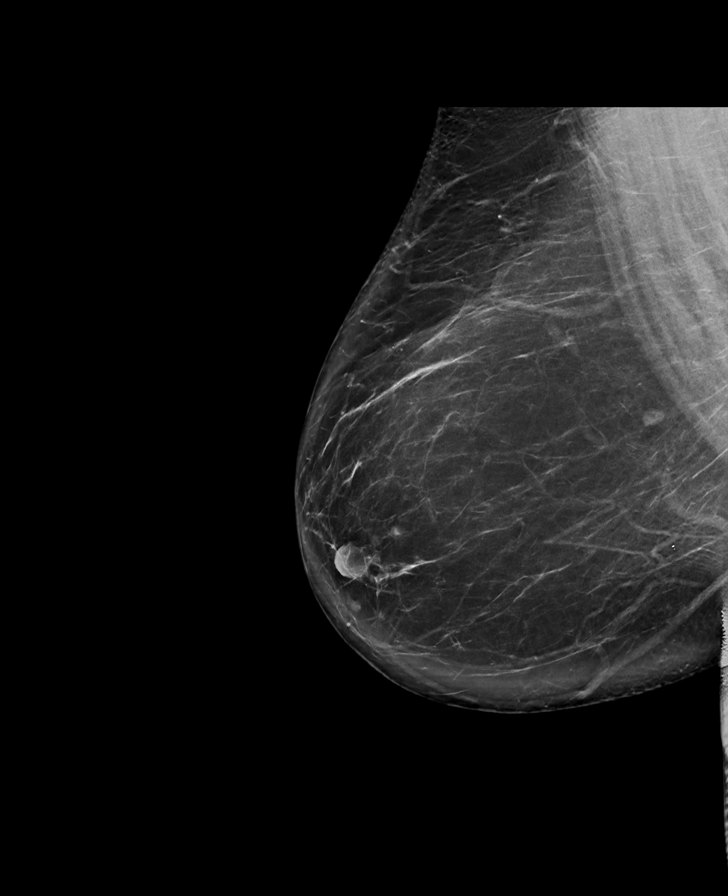

[L CC tomo · tomo slice 44/87.0]
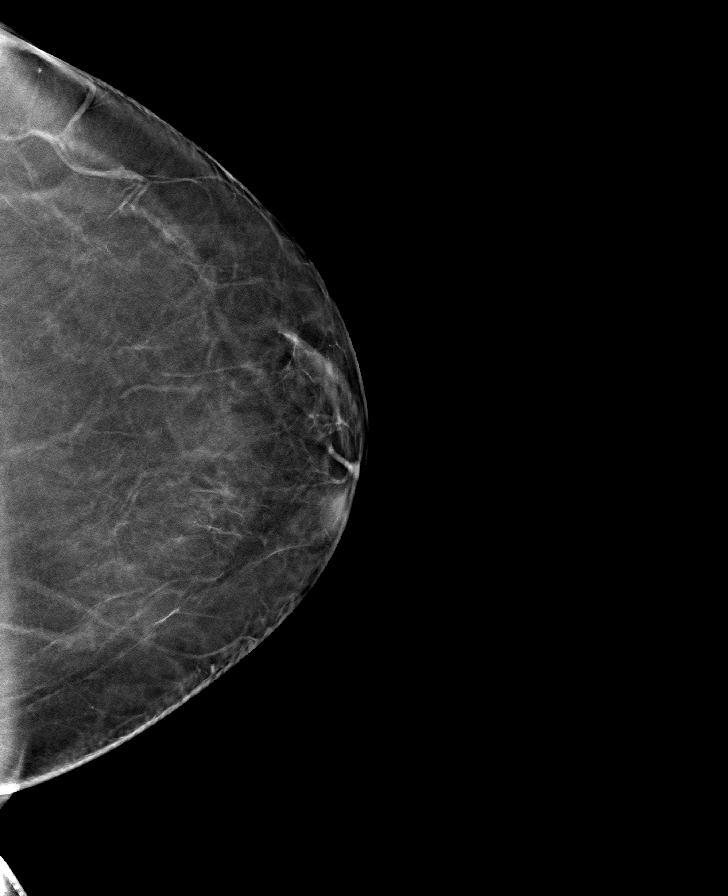

[L MLO tomo · tomo slice 47/93.0]
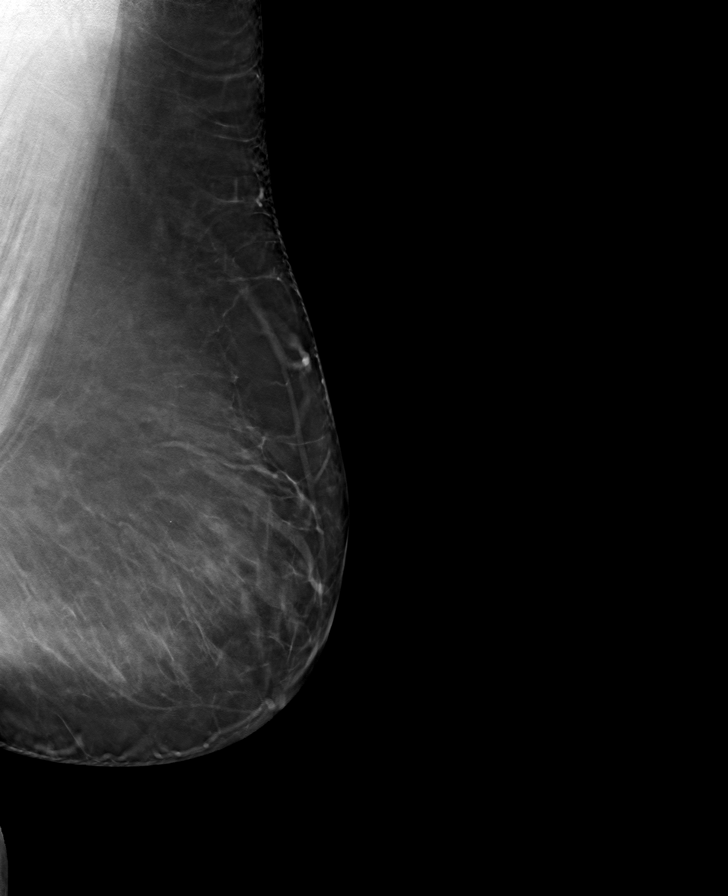

[R MLO tomo · tomo slice 49/96.0]
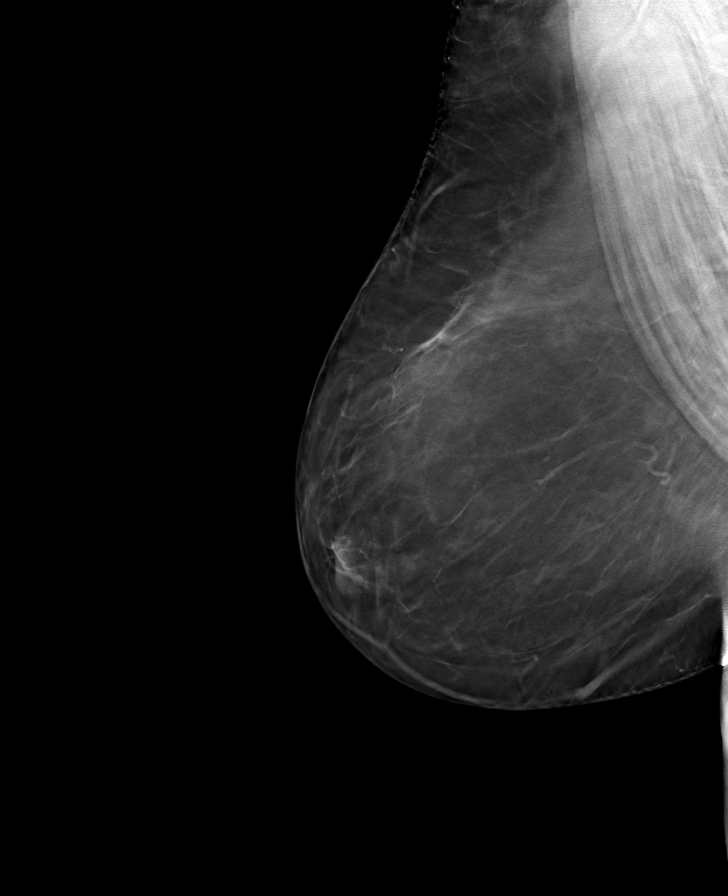

[R CC tomo · tomo slice 40/79.0]
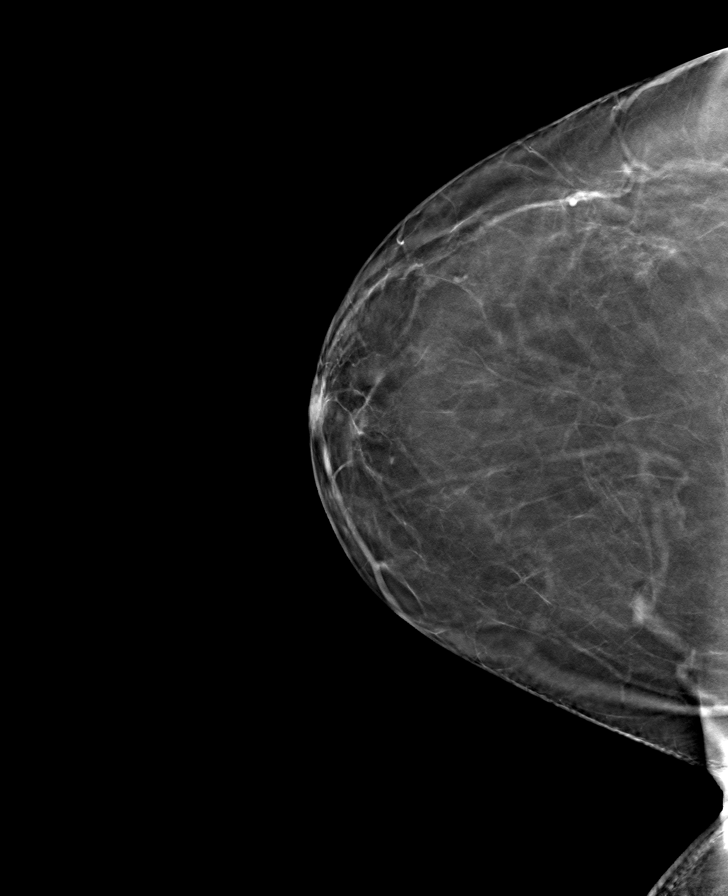

[8 of 24 positions shown; findings below may reference images not displayed]

ACR Breast Density Category b: There are scattered areas of
fibroglandular density.
FINDINGS: There are no findings suspicious for malignancy. Images were
processed with CAD.
IMPRESSION: No mammographic evidence of malignancy. A result letter of this
screening mammogram will be mailed directly to the patient.

RECOMMENDATION:
Screening mammogram in one year. (Code:CN-U-775)

BI-RADS CATEGORY  1: Negative.

## 2020-09-16 DIAGNOSIS — E119 Type 2 diabetes mellitus without complications: Secondary | ICD-10-CM | POA: Diagnosis not present

## 2020-09-16 DIAGNOSIS — Z6838 Body mass index (BMI) 38.0-38.9, adult: Secondary | ICD-10-CM | POA: Diagnosis not present

## 2020-09-16 DIAGNOSIS — Z1389 Encounter for screening for other disorder: Secondary | ICD-10-CM | POA: Diagnosis not present

## 2020-09-27 DIAGNOSIS — E119 Type 2 diabetes mellitus without complications: Secondary | ICD-10-CM | POA: Diagnosis not present

## 2020-09-27 DIAGNOSIS — Z6837 Body mass index (BMI) 37.0-37.9, adult: Secondary | ICD-10-CM | POA: Diagnosis not present

## 2020-09-27 DIAGNOSIS — Z1331 Encounter for screening for depression: Secondary | ICD-10-CM | POA: Diagnosis not present

## 2020-09-27 DIAGNOSIS — Z0001 Encounter for general adult medical examination with abnormal findings: Secondary | ICD-10-CM | POA: Diagnosis not present

## 2020-10-26 DIAGNOSIS — B078 Other viral warts: Secondary | ICD-10-CM | POA: Diagnosis not present

## 2020-11-16 DIAGNOSIS — J22 Unspecified acute lower respiratory infection: Secondary | ICD-10-CM | POA: Diagnosis not present

## 2021-05-21 ENCOUNTER — Encounter: Payer: Self-pay | Admitting: Gastroenterology

## 2021-08-12 DIAGNOSIS — L718 Other rosacea: Secondary | ICD-10-CM | POA: Diagnosis not present

## 2021-09-07 DIAGNOSIS — Z1331 Encounter for screening for depression: Secondary | ICD-10-CM | POA: Diagnosis not present

## 2021-09-07 DIAGNOSIS — Z6838 Body mass index (BMI) 38.0-38.9, adult: Secondary | ICD-10-CM | POA: Diagnosis not present

## 2021-09-07 DIAGNOSIS — E119 Type 2 diabetes mellitus without complications: Secondary | ICD-10-CM | POA: Diagnosis not present

## 2021-12-22 DIAGNOSIS — Z0100 Encounter for examination of eyes and vision without abnormal findings: Secondary | ICD-10-CM | POA: Diagnosis not present

## 2021-12-22 DIAGNOSIS — E119 Type 2 diabetes mellitus without complications: Secondary | ICD-10-CM | POA: Diagnosis not present

## 2021-12-22 DIAGNOSIS — H5200 Hypermetropia, unspecified eye: Secondary | ICD-10-CM | POA: Diagnosis not present

## 2022-01-26 ENCOUNTER — Encounter: Payer: Self-pay | Admitting: Gastroenterology

## 2022-02-09 ENCOUNTER — Other Ambulatory Visit: Payer: Self-pay

## 2022-02-09 ENCOUNTER — Ambulatory Visit (AMBULATORY_SURGERY_CENTER): Payer: Medicare HMO | Admitting: *Deleted

## 2022-02-09 VITALS — Ht 64.0 in | Wt 209.0 lb

## 2022-02-09 DIAGNOSIS — Z86010 Personal history of colonic polyps: Secondary | ICD-10-CM

## 2022-02-09 DIAGNOSIS — Z800 Family history of malignant neoplasm of digestive organs: Secondary | ICD-10-CM

## 2022-02-09 MED ORDER — PEG 3350-KCL-NA BICARB-NACL 420 G PO SOLR: 4000.0000 mL | Freq: Once | ORAL | 0 refills | Status: AC

## 2022-02-09 NOTE — Progress Notes (Signed)
Previsit conducted over telephone. ?Instructions mailed to home address and forwarded through [place]. ? ?No egg or soy allergy known to patient  ?No issues known to pt with past sedation with any surgeries or procedures ?Patient denies ever being told they had issues or difficulty with intubation  ?No FH of Malignant Hyperthermia ?Pt is not on diet pills ?Pt is not on  home 02  ?Pt is not on blood thinners  ?Pt denies issues with constipation  ?No A fib or A flutter ? ?Discussed with pt there will be an out-of-pocket cost for prep and that varies from $0 to 70 +  dollars - pt verbalized understanding  ? ?Due to the COVID-19 pandemic we are asking patients to follow certain guidelines in [place] and the [place]   ?Pt aware of COVID protocols and [facility] guidelines  ? ?PV completed over the phone. Pt verified name, DOB, address and insurance during PV today.  ?Pt mailed instruction packet with copy of consent form to read and not return, and instructions.  ?Pt encouraged to call with questions or issues.  ?If pt has My chart, procedure instructions sent via My Chart   ?

## 2022-03-05 ENCOUNTER — Encounter: Payer: Self-pay | Admitting: Certified Registered Nurse Anesthetist

## 2022-03-06 ENCOUNTER — Encounter: Payer: Self-pay | Admitting: Gastroenterology

## 2022-03-13 ENCOUNTER — Encounter: Payer: Self-pay | Admitting: Gastroenterology

## 2022-03-13 ENCOUNTER — Ambulatory Visit (AMBULATORY_SURGERY_CENTER): Payer: Medicare HMO | Admitting: Gastroenterology

## 2022-03-13 VITALS — BP 139/72 | HR 67 | Temp 97.3°F | Resp 15 | Ht 64.0 in | Wt 209.0 lb

## 2022-03-13 DIAGNOSIS — D128 Benign neoplasm of rectum: Secondary | ICD-10-CM

## 2022-03-13 DIAGNOSIS — Z86010 Personal history of colonic polyps: Secondary | ICD-10-CM | POA: Diagnosis not present

## 2022-03-13 DIAGNOSIS — J45909 Unspecified asthma, uncomplicated: Secondary | ICD-10-CM | POA: Diagnosis not present

## 2022-03-13 DIAGNOSIS — Z800 Family history of malignant neoplasm of digestive organs: Secondary | ICD-10-CM | POA: Diagnosis not present

## 2022-03-13 DIAGNOSIS — K621 Rectal polyp: Secondary | ICD-10-CM

## 2022-03-13 MED ORDER — SODIUM CHLORIDE 0.9 % IV SOLN: 500.0000 mL | Freq: Once | INTRAVENOUS | Status: DC

## 2022-03-13 NOTE — Progress Notes (Signed)
[facility] History and Physical ? ? ?Primary Care Physician:  Sharilyn Sites, MD ? ? ?Reason for Procedure:  History of adenomatous colon polyps ? ?Plan:    Surveillance colonoscopy with possible interventions as needed ? ? ? ? ?HPI: Katie Lucero is a very pleasant 71 y.o. female here for surveillance colonoscopy. ?Denies any nausea, vomiting, abdominal pain, melena or bright red blood per rectum ? ?The risks and benefits as well as alternatives of endoscopic procedure(s) have been discussed and reviewed. All questions answered. The patient agrees to proceed. ? ? ? ?Past Medical History:  ?Diagnosis Date  ? Asthma   ? Diabetes mellitus without complication ([place])   ? Family history of colon cancer   ? Family history of esophageal cancer   ? Family history of skin cancer   ? Family history of stomach cancer   ? Personal history of colonic polyps 06/26/2018  ? ? ?Past Surgical History:  ?Procedure Laterality Date  ? ABDOMINAL HYSTERECTOMY    ? 1968  ? COLONOSCOPY    ? POLYPECTOMY    ? TUBAL LIGATION    ? 1981  ? ? ?Prior to Admission medications   ?Medication Sig Start Date End Date Taking? Authorizing Provider  ?Cyanocobalamin (B-12) 100 MCG TABS Take by mouth.   Yes [provider]  ?metFORMIN (GLUCOPHAGE) 1000 MG tablet Take 1,000 mg by mouth 2 (two) times daily with a meal.   Yes [provider]  ?metroNIDAZOLE (METROCREAM) 0.75 % cream  04/22/18  Yes [provider]  ? ? ?Current Outpatient Medications  ?Medication Sig Dispense Refill  ? Cyanocobalamin (B-12) 100 MCG TABS Take by mouth.    ? metFORMIN (GLUCOPHAGE) 1000 MG tablet Take 1,000 mg by mouth 2 (two) times daily with a meal.    ? metroNIDAZOLE (METROCREAM) 0.75 % cream     ? ?Current Facility-Administered Medications  ?Medication Dose Route Frequency Provider Last Rate Last Admin  ? 0.9 %  sodium chloride infusion  500 mL Intravenous Once Safina Huard, Venia Minks, MD      ? ? ?Allergies as of 03/13/2022 - Review  Complete 03/13/2022  ?Allergen Reaction Noted  ? Levaquin [levofloxacin in d5w] Anaphylaxis 05/29/2018  ? ? ?Family History  ?Problem Relation Age of Onset  ? Heart disease Mother   ? Skin cancer Father   ? Heart disease Father   ? Colon cancer Sister 62  ? Esophageal cancer Paternal Uncle   ? Colon cancer Paternal Grandmother 55  ? Colon cancer Cousin   ?     dx >50  ? Other Brother   ?     brain tumor  ? Cancer Maternal Grandmother 30  ?     type unk  ? Stomach cancer Paternal Uncle   ? Colon cancer Cousin   ?     dx >50  ? Cancer Other 65  ?     bowel  ? Rectal cancer Neg Hx   ? ? ?Social History  ? ?Socioeconomic History  ? Marital status: Married  ?  Spouse name: Not on file  ? Number of children: Not on file  ? Years of education: Not on file  ? Highest education level: Not on file  ?Occupational History  ? Not on file  ?Tobacco Use  ? Smoking status: Never  ? Smokeless tobacco: Never  ?Substance and Sexual Activity  ? Alcohol use: Yes  ?  Alcohol/week: 0.0 standard drinks  ?  Comment: rarely  ? Drug use:  Never  ? Sexual activity: Not on file  ?Other Topics Concern  ? Not on file  ?Social History Narrative  ? Not on file  ? ?Social Determinants of Health  ? ?Financial Resource Strain: Not on file  ?Food Insecurity: Not on file  ?Transportation Needs: Not on file  ?Physical Activity: Not on file  ?Stress: Not on file  ?Social Connections: Not on file  ?Intimate Partner Violence: Not on file  ? ? ?Review of Systems: ? ?All other review of systems negative except as mentioned in the HPI. ? ?Physical Exam: ?Vital signs in last 24 hours: ?BP 127/66   Pulse (!) 57   Temp (!) 97.3 ?F (36.3 ?C) (Skin)   Ht '5\' 4"'$  (1.626 m)   Wt 209 lb (94.8 kg)   SpO2 99%   BMI 35.87 kg/m?  ?General:   Alert, NAD ?Lungs:  Clear .   ?Heart:  Regular rate and rhythm ?Abdomen:  Soft, nontender and nondistended. ?Neuro/Psych:  Alert and cooperative. Normal mood and affect. A and O x 3 ? ?Reviewed labs, radiology imaging, old records  and pertinent past GI work up ? ?Patient is appropriate for planned procedure(s) and anesthesia in an ambulatory setting ? ? ?K. Denzil Magnuson , MD ?[phone]  ? ? ?  ?

## 2022-03-13 NOTE — Progress Notes (Signed)
Called to room to assist during endoscopic procedure.  Patient ID and intended procedure confirmed with present staff. Received instructions for my participation in the procedure from the performing physician.  

## 2022-03-13 NOTE — Op Note (Signed)
[facility] ?Patient Name: Katie Lucero ?Procedure Date: 03/13/2022 9:46 AM ?MRN: [id number] ?Endoscopist: Mauri Pole , MD ?Age: 71 ?Referring MD:  ?Date of Birth: [birth year] ?Gender: Female ?Account #: [id number] ?Procedure:                Colonoscopy ?Indications:              High risk colon cancer surveillance: Personal  ?                          history of colonic polyps ?Medicines:                Monitored Anesthesia Care ?Procedure:                Pre-Anesthesia Assessment: ?                          - Prior to the procedure, a History and Physical  ?                          was performed, and patient medications and  ?                          allergies were reviewed. The patient's tolerance of  ?                          previous anesthesia was also reviewed. The risks  ?                          and benefits of the procedure and the sedation  ?                          options and risks were discussed with the patient.  ?                          All questions were answered, and informed consent  ?                          was obtained. Prior Anticoagulants: The patient has  ?                          taken no previous anticoagulant or antiplatelet  ?                          agents. ASA Grade Assessment: II - A patient with  ?                          mild systemic disease. After reviewing the risks  ?                          and benefits, the patient was deemed in  ?                          satisfactory condition to undergo the procedure. ?  After obtaining informed consent, the colonoscope  ?                          was passed under direct vision. Throughout the  ?                          procedure, the patient's blood pressure, pulse, and  ?                          oxygen saturations were monitored continuously. The  ?                          Olympus PCF-H190DL ([id number]) Colonoscope was  ?                          introduced through the anus and  advanced to the the  ?                          cecum, identified by appendiceal orifice and  ?                          ileocecal valve. The colonoscopy was performed  ?                          without difficulty. The patient tolerated the  ?                          procedure well. The quality of the bowel  ?                          preparation was excellent. The ileocecal valve,  ?                          appendiceal orifice, and rectum were photographed. ?Scope In: 9:50:55 AM ?Scope Out: 10:08:29 AM ?Scope Withdrawal Time: 0 hours 10 minutes 25 seconds  ?Total Procedure Duration: 0 hours 17 minutes 34 seconds  ?Findings:                 The perianal and digital rectal examinations were  ?                          normal. ?                          A 3 mm polyp was found in the rectum. The polyp was  ?                          sessile. The polyp was removed with a cold snare.  ?                          Resection and retrieval were complete. ?                          A few small-mouthed diverticula were found in the  ?  sigmoid colon. ?                          Non-bleeding external and internal hemorrhoids were  ?                          found during retroflexion. The hemorrhoids were  ?                          medium-sized. ?Complications:            No immediate complications. ?Estimated Blood Loss:     Estimated blood loss was minimal. ?Impression:               - One 3 mm polyp in the rectum, removed with a cold  ?                          snare. Resected and retrieved. ?                          - Diverticulosis in the sigmoid colon. ?                          - Non-bleeding external and internal hemorrhoids. ?Recommendation:           - Patient has a contact number available for  ?                          emergencies. The signs and symptoms of potential  ?                          delayed complications were discussed with the  ?                          patient. Return to  normal activities tomorrow.  ?                          Written discharge instructions were provided to the  ?                          patient. ?                          - Resume previous diet. ?                          - Continue present medications. ?                          - Await pathology results. ?                          - Repeat colonoscopy in 5 years for surveillance  ?                          based on pathology results. ?Mauri Pole, MD ?03/13/2022 10:17:57 AM ?This report has been signed electronically. ?

## 2022-03-13 NOTE — Patient Instructions (Signed)
?Thank you for letting us take care of your healthcare needs today. ?Please see handouts given to you on Polyps, Diverticulosis and Hemorrhoids. ? ? ?YOU HAD AN ENDOSCOPIC PROCEDURE TODAY AT [facility]:   Refer to the procedure report that was given to you for any specific questions about what was found during the examination.  If the procedure report does not answer your questions, please call your gastroenterologist to clarify.  If you requested that your care partner not be given the details of your procedure findings, then the procedure report has been included in a sealed envelope for you to review at your convenience later. ? ?YOU SHOULD EXPECT: Some feelings of bloating in the abdomen. Passage of more gas than usual.  Walking can help get rid of the air that was put into your GI tract during the procedure and reduce the bloating. If you had a lower endoscopy (such as a colonoscopy or flexible sigmoidoscopy) you may notice spotting of blood in your stool or on the toilet paper. If you underwent a bowel prep for your procedure, you may not have a normal bowel movement for a few days. ? ?Please Note:  You might notice some irritation and congestion in your nose or some drainage.  This is from the oxygen used during your procedure.  There is no need for concern and it should clear up in a day or so. ? ?SYMPTOMS TO REPORT IMMEDIATELY: ? ?Following lower endoscopy (colonoscopy or flexible sigmoidoscopy): ? Excessive amounts of blood in the stool ? Significant tenderness or worsening of abdominal pains ? Swelling of the abdomen that is new, acute ? Fever of 100?F or higher ? ?  ?For urgent or emergent issues, a gastroenterologist can be reached at any hour by calling [phone]. ?Do not use MyChart messaging for urgent concerns.  ? ? ?DIET:  We do recommend a small meal at first, but then you may proceed to your regular diet.  Drink plenty of fluids but you should avoid alcoholic beverages for  24 hours. ? ?ACTIVITY:  You should plan to take it easy for the rest of today and you should NOT DRIVE or use heavy machinery until tomorrow (because of the sedation medicines used during the test).   ? ?FOLLOW UP: ?Our staff will call the number listed on your records 48-72 hours following your procedure to check on you and address any questions or concerns that you may have regarding the information given to you following your procedure. If we do not reach you, we will leave a message.  We will attempt to reach you two times.  During this call, we will ask if you have developed any symptoms of COVID 19. If you develop any symptoms (ie: fever, flu-like symptoms, shortness of breath, cough etc.) before then, please call [phone].  If you test positive for Covid 19 in the 2 weeks post procedure, please call and report this information to [place].   ? ?If any biopsies were taken you will be contacted by phone or by letter within the next 1-3 weeks.  Please call us at [phone] if you have not heard about the biopsies in 3 weeks.  ? ? ?SIGNATURES/CONFIDENTIALITY: ?You and/or your care partner have signed paperwork which will be entered into your electronic medical record.  These signatures attest to the fact that that the information above on your After Visit Summary has been reviewed and is understood.  Full responsibility of the confidentiality of this discharge  information lies with you and/or your care-partner.  ?

## 2022-03-13 NOTE — Progress Notes (Signed)
Pt's states no medical or surgical changes since previsit or office visit. VS assessed by C.W 

## 2022-03-15 ENCOUNTER — Telehealth: Payer: Self-pay | Admitting: *Deleted

## 2022-03-15 NOTE — Telephone Encounter (Signed)
Left message on f/u call 

## 2022-03-15 NOTE — Telephone Encounter (Signed)
Second follow up call attempt.  Message left. ?

## 2022-03-22 ENCOUNTER — Encounter: Payer: Self-pay | Admitting: Gastroenterology

## 2022-04-17 DIAGNOSIS — R6889 Other general symptoms and signs: Secondary | ICD-10-CM | POA: Diagnosis not present

## 2022-04-17 DIAGNOSIS — E119 Type 2 diabetes mellitus without complications: Secondary | ICD-10-CM | POA: Diagnosis not present

## 2022-04-17 DIAGNOSIS — L2389 Allergic contact dermatitis due to other agents: Secondary | ICD-10-CM | POA: Diagnosis not present

## 2022-04-19 DIAGNOSIS — E1136 Type 2 diabetes mellitus with diabetic cataract: Secondary | ICD-10-CM | POA: Diagnosis not present

## 2022-04-19 DIAGNOSIS — Z8249 Family history of ischemic heart disease and other diseases of the circulatory system: Secondary | ICD-10-CM | POA: Diagnosis not present

## 2022-04-19 DIAGNOSIS — R030 Elevated blood-pressure reading, without diagnosis of hypertension: Secondary | ICD-10-CM | POA: Diagnosis not present

## 2022-04-19 DIAGNOSIS — Z7984 Long term (current) use of oral hypoglycemic drugs: Secondary | ICD-10-CM | POA: Diagnosis not present

## 2022-04-19 DIAGNOSIS — Z833 Family history of diabetes mellitus: Secondary | ICD-10-CM | POA: Diagnosis not present

## 2022-05-19 DIAGNOSIS — J019 Acute sinusitis, unspecified: Secondary | ICD-10-CM | POA: Diagnosis not present

## 2022-11-20 DIAGNOSIS — L718 Other rosacea: Secondary | ICD-10-CM | POA: Diagnosis not present

## 2023-01-03 DIAGNOSIS — H2513 Age-related nuclear cataract, bilateral: Secondary | ICD-10-CM | POA: Diagnosis not present

## 2023-01-03 DIAGNOSIS — H52223 Regular astigmatism, bilateral: Secondary | ICD-10-CM | POA: Diagnosis not present

## 2023-01-03 DIAGNOSIS — E119 Type 2 diabetes mellitus without complications: Secondary | ICD-10-CM | POA: Diagnosis not present

## 2023-01-03 DIAGNOSIS — D3132 Benign neoplasm of left choroid: Secondary | ICD-10-CM | POA: Diagnosis not present

## 2023-01-03 DIAGNOSIS — H524 Presbyopia: Secondary | ICD-10-CM | POA: Diagnosis not present

## 2023-01-03 DIAGNOSIS — H5203 Hypermetropia, bilateral: Secondary | ICD-10-CM | POA: Diagnosis not present

## 2023-01-30 DIAGNOSIS — M25532 Pain in left wrist: Secondary | ICD-10-CM | POA: Diagnosis not present

## 2023-03-05 DIAGNOSIS — Z124 Encounter for screening for malignant neoplasm of cervix: Secondary | ICD-10-CM | POA: Diagnosis not present

## 2023-03-05 DIAGNOSIS — Z6837 Body mass index (BMI) 37.0-37.9, adult: Secondary | ICD-10-CM | POA: Diagnosis not present

## 2023-03-05 DIAGNOSIS — Z1231 Encounter for screening mammogram for malignant neoplasm of breast: Secondary | ICD-10-CM | POA: Diagnosis not present

## 2023-03-05 DIAGNOSIS — Z01419 Encounter for gynecological examination (general) (routine) without abnormal findings: Secondary | ICD-10-CM | POA: Diagnosis not present

## 2023-03-05 DIAGNOSIS — Z90711 Acquired absence of uterus with remaining cervical stump: Secondary | ICD-10-CM | POA: Diagnosis not present

## 2023-03-05 DIAGNOSIS — N951 Menopausal and female climacteric states: Secondary | ICD-10-CM | POA: Diagnosis not present

## 2023-03-05 DIAGNOSIS — Z01411 Encounter for gynecological examination (general) (routine) with abnormal findings: Secondary | ICD-10-CM | POA: Diagnosis not present

## 2023-03-05 DIAGNOSIS — R39198 Other difficulties with micturition: Secondary | ICD-10-CM | POA: Diagnosis not present

## 2023-03-05 DIAGNOSIS — X58XXXA Exposure to other specified factors, initial encounter: Secondary | ICD-10-CM | POA: Diagnosis not present

## 2023-03-06 ENCOUNTER — Other Ambulatory Visit (HOSPITAL_COMMUNITY): Payer: Self-pay | Admitting: Obstetrics & Gynecology

## 2023-03-06 DIAGNOSIS — M25532 Pain in left wrist: Secondary | ICD-10-CM | POA: Diagnosis not present

## 2023-03-06 DIAGNOSIS — N951 Menopausal and female climacteric states: Secondary | ICD-10-CM

## 2023-04-30 DIAGNOSIS — I10 Essential (primary) hypertension: Secondary | ICD-10-CM | POA: Diagnosis not present

## 2023-04-30 DIAGNOSIS — Z79899 Other long term (current) drug therapy: Secondary | ICD-10-CM | POA: Diagnosis not present

## 2023-04-30 DIAGNOSIS — Z780 Asymptomatic menopausal state: Secondary | ICD-10-CM | POA: Diagnosis not present

## 2023-04-30 DIAGNOSIS — R946 Abnormal results of thyroid function studies: Secondary | ICD-10-CM | POA: Diagnosis not present

## 2023-04-30 DIAGNOSIS — Z13220 Encounter for screening for lipoid disorders: Secondary | ICD-10-CM | POA: Diagnosis not present

## 2023-04-30 DIAGNOSIS — E119 Type 2 diabetes mellitus without complications: Secondary | ICD-10-CM | POA: Diagnosis not present

## 2023-05-31 DIAGNOSIS — I10 Essential (primary) hypertension: Secondary | ICD-10-CM | POA: Diagnosis not present

## 2023-05-31 DIAGNOSIS — Z79899 Other long term (current) drug therapy: Secondary | ICD-10-CM | POA: Diagnosis not present

## 2023-05-31 DIAGNOSIS — E119 Type 2 diabetes mellitus without complications: Secondary | ICD-10-CM | POA: Diagnosis not present

## 2023-09-25 DIAGNOSIS — Z008 Encounter for other general examination: Secondary | ICD-10-CM | POA: Diagnosis not present

## 2023-11-09 DIAGNOSIS — Z79899 Other long term (current) drug therapy: Secondary | ICD-10-CM | POA: Diagnosis not present

## 2023-11-09 DIAGNOSIS — E119 Type 2 diabetes mellitus without complications: Secondary | ICD-10-CM | POA: Diagnosis not present

## 2023-11-09 DIAGNOSIS — Z13220 Encounter for screening for lipoid disorders: Secondary | ICD-10-CM | POA: Diagnosis not present

## 2023-11-16 DIAGNOSIS — I10 Essential (primary) hypertension: Secondary | ICD-10-CM | POA: Diagnosis not present

## 2023-11-16 DIAGNOSIS — R946 Abnormal results of thyroid function studies: Secondary | ICD-10-CM | POA: Diagnosis not present

## 2023-11-16 DIAGNOSIS — Z79899 Other long term (current) drug therapy: Secondary | ICD-10-CM | POA: Diagnosis not present

## 2023-11-16 DIAGNOSIS — E118 Type 2 diabetes mellitus with unspecified complications: Secondary | ICD-10-CM | POA: Diagnosis not present

## 2023-12-24 DIAGNOSIS — H5203 Hypermetropia, bilateral: Secondary | ICD-10-CM | POA: Diagnosis not present

## 2024-03-13 DIAGNOSIS — L819 Disorder of pigmentation, unspecified: Secondary | ICD-10-CM | POA: Diagnosis not present

## 2024-03-13 DIAGNOSIS — L718 Other rosacea: Secondary | ICD-10-CM | POA: Diagnosis not present

## 2024-04-29 DIAGNOSIS — E1122 Type 2 diabetes mellitus with diabetic chronic kidney disease: Secondary | ICD-10-CM | POA: Diagnosis not present

## 2024-04-29 DIAGNOSIS — Z881 Allergy status to other antibiotic agents status: Secondary | ICD-10-CM | POA: Diagnosis not present

## 2024-04-29 DIAGNOSIS — Z7984 Long term (current) use of oral hypoglycemic drugs: Secondary | ICD-10-CM | POA: Diagnosis not present

## 2024-04-29 DIAGNOSIS — Z008 Encounter for other general examination: Secondary | ICD-10-CM | POA: Diagnosis not present

## 2024-04-29 DIAGNOSIS — Z833 Family history of diabetes mellitus: Secondary | ICD-10-CM | POA: Diagnosis not present

## 2024-04-29 DIAGNOSIS — Z8249 Family history of ischemic heart disease and other diseases of the circulatory system: Secondary | ICD-10-CM | POA: Diagnosis not present

## 2024-04-29 DIAGNOSIS — Z809 Family history of malignant neoplasm, unspecified: Secondary | ICD-10-CM | POA: Diagnosis not present

## 2024-04-29 DIAGNOSIS — I129 Hypertensive chronic kidney disease with stage 1 through stage 4 chronic kidney disease, or unspecified chronic kidney disease: Secondary | ICD-10-CM | POA: Diagnosis not present

## 2024-04-29 DIAGNOSIS — N182 Chronic kidney disease, stage 2 (mild): Secondary | ICD-10-CM | POA: Diagnosis not present

## 2024-04-29 DIAGNOSIS — Z6839 Body mass index (BMI) 39.0-39.9, adult: Secondary | ICD-10-CM | POA: Diagnosis not present

## 2024-04-30 DIAGNOSIS — I10 Essential (primary) hypertension: Secondary | ICD-10-CM | POA: Diagnosis not present

## 2024-04-30 DIAGNOSIS — H8109 Meniere's disease, unspecified ear: Secondary | ICD-10-CM | POA: Diagnosis not present

## 2024-04-30 DIAGNOSIS — R42 Dizziness and giddiness: Secondary | ICD-10-CM | POA: Diagnosis not present

## 2024-04-30 DIAGNOSIS — E118 Type 2 diabetes mellitus with unspecified complications: Secondary | ICD-10-CM | POA: Diagnosis not present

## 2024-04-30 DIAGNOSIS — H538 Other visual disturbances: Secondary | ICD-10-CM | POA: Diagnosis not present

## 2024-05-01 ENCOUNTER — Other Ambulatory Visit (HOSPITAL_COMMUNITY): Payer: Self-pay | Admitting: Family Medicine

## 2024-05-01 DIAGNOSIS — R42 Dizziness and giddiness: Secondary | ICD-10-CM

## 2024-05-01 DIAGNOSIS — H538 Other visual disturbances: Secondary | ICD-10-CM

## 2024-05-05 ENCOUNTER — Ambulatory Visit (HOSPITAL_COMMUNITY)
Admission: RE | Admit: 2024-05-05 | Discharge: 2024-05-05 | Disposition: A | Discharge status: Home / Self Care | Source: Ambulatory Visit | Attending: Family Medicine | Admitting: Family Medicine

## 2024-05-05 DIAGNOSIS — H538 Other visual disturbances: Secondary | ICD-10-CM | POA: Diagnosis not present

## 2024-05-05 DIAGNOSIS — R9082 White matter disease, unspecified: Secondary | ICD-10-CM | POA: Diagnosis not present

## 2024-05-05 DIAGNOSIS — R42 Dizziness and giddiness: Secondary | ICD-10-CM | POA: Insufficient documentation

## 2024-05-05 MED ORDER — GADOBUTROL 1 MMOL/ML IV SOLN
10.0000 mL | Freq: Once | INTRAVENOUS | Status: AC | PRN
  Administered 2024-05-05: 10 mL via INTRAVENOUS

## 2024-05-08 DIAGNOSIS — E118 Type 2 diabetes mellitus with unspecified complications: Secondary | ICD-10-CM | POA: Diagnosis not present

## 2024-05-08 DIAGNOSIS — Z79899 Other long term (current) drug therapy: Secondary | ICD-10-CM | POA: Diagnosis not present

## 2024-05-08 DIAGNOSIS — Z13220 Encounter for screening for lipoid disorders: Secondary | ICD-10-CM | POA: Diagnosis not present

## 2024-05-08 DIAGNOSIS — R946 Abnormal results of thyroid function studies: Secondary | ICD-10-CM | POA: Diagnosis not present

## 2024-05-08 DIAGNOSIS — I10 Essential (primary) hypertension: Secondary | ICD-10-CM | POA: Diagnosis not present

## 2024-05-15 DIAGNOSIS — G720 Drug-induced myopathy: Secondary | ICD-10-CM | POA: Diagnosis not present

## 2024-05-15 DIAGNOSIS — E118 Type 2 diabetes mellitus with unspecified complications: Secondary | ICD-10-CM | POA: Diagnosis not present

## 2024-05-15 DIAGNOSIS — E7800 Pure hypercholesterolemia, unspecified: Secondary | ICD-10-CM | POA: Diagnosis not present

## 2024-05-15 DIAGNOSIS — I10 Essential (primary) hypertension: Secondary | ICD-10-CM | POA: Diagnosis not present

## 2024-05-15 DIAGNOSIS — Z0000 Encounter for general adult medical examination without abnormal findings: Secondary | ICD-10-CM | POA: Diagnosis not present
